# Patient Record
Sex: Female | Born: 1960 | Race: White | Hispanic: No | Marital: Single | Smoking: Never smoker
Health system: Southern US, Community
[De-identification: ages and names within clinical notes are randomized; demographics above are authoritative.]

## PROBLEM LIST (undated history)

## (undated) DIAGNOSIS — G43909 Migraine, unspecified, not intractable, without status migrainosus: Secondary | ICD-10-CM

## (undated) DIAGNOSIS — I480 Paroxysmal atrial fibrillation: Secondary | ICD-10-CM

## (undated) DIAGNOSIS — F32A Depression, unspecified: Secondary | ICD-10-CM

## (undated) DIAGNOSIS — K2210 Ulcer of esophagus without bleeding: Secondary | ICD-10-CM

## (undated) DIAGNOSIS — E785 Hyperlipidemia, unspecified: Secondary | ICD-10-CM

## (undated) DIAGNOSIS — G4700 Insomnia, unspecified: Secondary | ICD-10-CM

## (undated) DIAGNOSIS — I4891 Unspecified atrial fibrillation: Secondary | ICD-10-CM

## (undated) DIAGNOSIS — K219 Gastro-esophageal reflux disease without esophagitis: Secondary | ICD-10-CM

## (undated) DIAGNOSIS — H409 Unspecified glaucoma: Secondary | ICD-10-CM

## (undated) HISTORY — PX: CHOLECYSTECTOMY: SHX55

## (undated) HISTORY — DX: Depression, unspecified: F32.A

## (undated) HISTORY — DX: Insomnia, unspecified: G47.00

## (undated) HISTORY — DX: Paroxysmal atrial fibrillation: I48.0

## (undated) HISTORY — DX: Ulcer of esophagus without bleeding: K22.10

---

## 1999-06-16 ENCOUNTER — Encounter: Admission: RE | Admit: 1999-06-16 | Discharge: 1999-07-11 | Payer: Self-pay | Admitting: Family Medicine

## 1999-09-30 ENCOUNTER — Other Ambulatory Visit: Admission: RE | Admit: 1999-09-30 | Discharge: 1999-09-30 | Payer: Self-pay | Admitting: Obstetrics & Gynecology

## 1999-10-14 ENCOUNTER — Encounter: Admission: RE | Admit: 1999-10-14 | Discharge: 1999-10-14 | Payer: Self-pay | Admitting: Obstetrics & Gynecology

## 1999-10-14 ENCOUNTER — Encounter: Payer: Self-pay | Admitting: Obstetrics & Gynecology

## 2000-10-31 ENCOUNTER — Other Ambulatory Visit: Admission: RE | Admit: 2000-10-31 | Discharge: 2000-10-31 | Payer: Self-pay | Admitting: Obstetrics & Gynecology

## 2001-11-04 ENCOUNTER — Other Ambulatory Visit: Admission: RE | Admit: 2001-11-04 | Discharge: 2001-11-04 | Payer: Self-pay | Admitting: Obstetrics & Gynecology

## 2008-11-20 ENCOUNTER — Encounter: Admission: RE | Admit: 2008-11-20 | Discharge: 2008-11-20 | Payer: Self-pay | Admitting: Family Medicine

## 2008-12-17 ENCOUNTER — Encounter (HOSPITAL_COMMUNITY): Admission: RE | Admit: 2008-12-17 | Discharge: 2009-01-05 | Payer: Self-pay | Admitting: Family Medicine

## 2012-05-16 ENCOUNTER — Encounter (INDEPENDENT_AMBULATORY_CARE_PROVIDER_SITE_OTHER): Payer: BC Managed Care – PPO | Admitting: *Deleted

## 2012-05-16 DIAGNOSIS — I8390 Asymptomatic varicose veins of unspecified lower extremity: Secondary | ICD-10-CM

## 2020-09-16 LAB — HM PAP SMEAR

## 2021-05-08 ENCOUNTER — Emergency Department (HOSPITAL_BASED_OUTPATIENT_CLINIC_OR_DEPARTMENT_OTHER): Payer: Self-pay

## 2021-05-08 ENCOUNTER — Emergency Department (HOSPITAL_BASED_OUTPATIENT_CLINIC_OR_DEPARTMENT_OTHER)
Admission: EM | Admit: 2021-05-08 | Discharge: 2021-05-08 | Disposition: A | Discharge status: Home / Self Care | Payer: Self-pay | Attending: Emergency Medicine | Admitting: Emergency Medicine

## 2021-05-08 ENCOUNTER — Other Ambulatory Visit: Payer: Self-pay

## 2021-05-08 ENCOUNTER — Encounter (HOSPITAL_BASED_OUTPATIENT_CLINIC_OR_DEPARTMENT_OTHER): Payer: Self-pay | Admitting: Emergency Medicine

## 2021-05-08 DIAGNOSIS — D72829 Elevated white blood cell count, unspecified: Secondary | ICD-10-CM | POA: Diagnosis not present

## 2021-05-08 DIAGNOSIS — R112 Nausea with vomiting, unspecified: Secondary | ICD-10-CM | POA: Insufficient documentation

## 2021-05-08 DIAGNOSIS — R1013 Epigastric pain: Secondary | ICD-10-CM | POA: Insufficient documentation

## 2021-05-08 HISTORY — DX: Unspecified atrial fibrillation: I48.91

## 2021-05-08 HISTORY — DX: Hyperlipidemia, unspecified: E78.5

## 2021-05-08 HISTORY — DX: Unspecified glaucoma: H40.9

## 2021-05-08 HISTORY — DX: Gastro-esophageal reflux disease without esophagitis: K21.9

## 2021-05-08 HISTORY — DX: Migraine, unspecified, not intractable, without status migrainosus: G43.909

## 2021-05-08 LAB — URINALYSIS, MICROSCOPIC (REFLEX)

## 2021-05-08 LAB — URINALYSIS, ROUTINE W REFLEX MICROSCOPIC
Bilirubin Urine: NEGATIVE
Glucose, UA: NEGATIVE mg/dL
Glucose, UA: NEGATIVE mg/dL
Ketones, ur: >=80 mg/dL — AB
Ketones, ur: >=80 mg/dL — AB
Leukocytes,Ua: NEGATIVE
Nitrite: NEGATIVE
Nitrite: NEGATIVE
Protein, ur: 100 mg/dL — AB
Protein, ur: 30 mg/dL — AB
Specific Gravity, Urine: >=1.03 (ref 1.005–1.030)
Specific Gravity, Urine: 1.02 (ref 1.005–1.030)
pH: 5.5 (ref 5.0–8.0)
pH: 5.5 (ref 5.0–8.0)

## 2021-05-08 LAB — HEPATIC FUNCTION PANEL
ALT: 27 U/L (ref 0–44)
AST: 25 U/L (ref 15–41)
Albumin: 4.7 g/dL (ref 3.5–5.0)
Alkaline Phosphatase: 110 U/L (ref 38–126)
Bilirubin, Direct: 0.1 mg/dL (ref 0.0–0.2)
Indirect Bilirubin: 1.1 mg/dL — ABNORMAL HIGH (ref 0.3–0.9)
Total Bilirubin: 1.2 mg/dL (ref 0.3–1.2)
Total Protein: 8.3 g/dL — ABNORMAL HIGH (ref 6.5–8.1)

## 2021-05-08 LAB — BASIC METABOLIC PANEL
Anion gap: 18 — ABNORMAL HIGH (ref 5–15)
BUN: 16 mg/dL (ref 8–23)
CO2: 12 mmol/L — ABNORMAL LOW (ref 22–32)
Calcium: 8.8 mg/dL — ABNORMAL LOW (ref 8.9–10.3)
Chloride: 108 mmol/L (ref 98–111)
Creatinine, Ser: 0.89 mg/dL (ref 0.44–1.00)
GFR, Estimated: >60 mL/min (ref >60)
Glucose, Bld: 100 mg/dL — ABNORMAL HIGH (ref 70–99)
Potassium: 4 mmol/L (ref 3.5–5.1)
Sodium: 138 mmol/L (ref 135–145)

## 2021-05-08 LAB — LIPASE, BLOOD: Lipase: 23 U/L (ref 11–51)

## 2021-05-08 LAB — CBC WITH DIFFERENTIAL/PLATELET
Abs Immature Granulocytes: 0.02 10*3/uL (ref 0.00–0.07)
Basophils Absolute: 0 10*3/uL (ref 0.0–0.1)
Basophils Relative: 0 %
Eosinophils Absolute: 0 10*3/uL (ref 0.0–0.5)
Eosinophils Relative: 0 %
HCT: 44.2 % (ref 36.0–46.0)
Hemoglobin: 14.7 g/dL (ref 12.0–15.0)
Immature Granulocytes: 0 %
Lymphocytes Relative: 11 %
Lymphs Abs: 1.2 10*3/uL (ref 0.7–4.0)
MCH: 32.6 pg (ref 26.0–34.0)
MCHC: 33.3 g/dL (ref 30.0–36.0)
MCV: 98 fL (ref 80.0–100.0)
Monocytes Absolute: 0.4 10*3/uL (ref 0.1–1.0)
Monocytes Relative: 3 %
Neutro Abs: 10 10*3/uL — ABNORMAL HIGH (ref 1.7–7.7)
Neutrophils Relative %: 86 %
Platelets: 345 10*3/uL (ref 150–400)
RBC: 4.51 MIL/uL (ref 3.87–5.11)
RDW: 13 % (ref 11.5–15.5)
WBC: 11.7 10*3/uL — ABNORMAL HIGH (ref 4.0–10.5)
nRBC: 0 % (ref 0.0–0.2)

## 2021-05-08 MED ORDER — ONDANSETRON 4 MG PO TBDP: 4.0000 mg | ORAL_TABLET | Freq: Three times a day (TID) | ORAL | 0 refills | Status: DC | PRN

## 2021-05-08 MED ORDER — SODIUM CHLORIDE 0.9 % IV BOLUS
1000.0000 mL | Freq: Once | INTRAVENOUS | Status: AC
  Administered 2021-05-08: 1000 mL via INTRAVENOUS

## 2021-05-08 MED ORDER — ALUM & MAG HYDROXIDE-SIMETH 200-200-20 MG/5ML PO SUSP
30.0000 mL | Freq: Once | ORAL | Status: AC
Start: 2021-05-08 — End: 2021-05-08
  Administered 2021-05-08: 30 mL via ORAL
  Filled 2021-05-08: qty 30

## 2021-05-08 MED ORDER — SUCRALFATE 1 G PO TABS: 1.0000 g | ORAL_TABLET | Freq: Three times a day (TID) | ORAL | 0 refills | Status: DC

## 2021-05-08 MED ORDER — IOHEXOL 300 MG/ML  SOLN
100.0000 mL | Freq: Once | INTRAMUSCULAR | Status: AC | PRN
  Administered 2021-05-08: 100 mL via INTRAVENOUS

## 2021-05-08 MED ORDER — SUCRALFATE 1 G PO TABS
1.0000 g | ORAL_TABLET | Freq: Once | ORAL | Status: AC
  Administered 2021-05-08: 1 g via ORAL
  Filled 2021-05-08: qty 1

## 2021-05-08 MED ORDER — LIDOCAINE VISCOUS HCL 2 % MT SOLN
15.0000 mL | Freq: Once | OROMUCOSAL | Status: AC
Start: 2021-05-08 — End: 2021-05-08
  Administered 2021-05-08: 15 mL via ORAL
  Filled 2021-05-08: qty 15

## 2021-05-08 NOTE — ED Triage Notes (Signed)
Pt reports NV since Fri after taking Mag Citrate on the advice of weight loss clinic (last ozempic shot was 2 wks ago); received Phenergan IM at [facility] and was referred here for further eval ?

## 2021-05-08 NOTE — ED Provider Notes (Signed)
Care assumed from Dr. Audley Hose.  At time of transfer care, patient was awaiting CT scan to rule out intra-abdominal pathology prior to suspected discharge after p.o. challenge. ? ?Patient CT scan returned without concerning findings at this time.  She does have diverticulosis but no diverticulitis.  No evidence of obstruction or other abnormalities in the esophagus or stomach at this time. ? ?Patient continued to have some nausea, vomiting, and discomfort.  She reports the pain was in her upper abdomen so we tried a GI cocktail which helped somewhat.  We will give prescription for Carafate and patient has famotidine to use at home.  We instructed her to start taking that again and have her follow-up with GI as I suspect she may have a gastritis causing some of her symptoms and discomfort.  Patient already had prescription for nausea medicine called in but will add the Carafate as well.  Patient understood return precautions and follow-up instructions and was discharged in good condition after passing a p.o. challenge and was reassuring vital signs. ? ?Clinical Impression: ?1. Nausea and vomiting, unspecified vomiting type   ?2. Epigastric pain   ? ? ?Disposition: Discharge ? ?Condition: Good ? ?I have discussed the results, Dx and Tx plan with the pt(& family if present). He/she/they expressed understanding and agree(s) with the plan. Discharge instructions discussed at great length. Strict return precautions discussed and pt &/or family have verbalized understanding of the instructions. No further questions at time of discharge.  ? ? ?New Prescriptions  ? ONDANSETRON (ZOFRAN-ODT) 4 MG DISINTEGRATING TABLET    Take 1 tablet (4 mg total) by mouth every 8 (eight) hours as needed for nausea or vomiting.  ? SUCRALFATE (CARAFATE) 1 G TABLET    Take 1 tablet (1 g total) by mouth 4 (four) times daily -  with meals and at bedtime.  ? ? ?Follow Up: ?[facility] ?[address] ?[address] ?[place]  [address] [phone] ? ? ? ? ?[facility] ?[address] [place] ?[place] [address] ?[phone] ? ? ? ?[facility] ?[address] ?[id number] ?[place] [address] ?[phone] ? ? ? ?Rosie Fate, MD ? ? ? ? ? ? ? ?  ?December Hedtke, Canary Brim, MD ?05/08/21 2052 ? ?

## 2021-05-08 NOTE — ED Notes (Signed)
Water and saltine crackers provided for po challenge ?

## 2021-05-08 NOTE — Discharge Instructions (Signed)
Your history, exam, work-up today did not show an acute or surgical cause of your symptoms that require admission at this time.  We did suspect a gastritis or enteritis causing the nausea and vomiting and discomfort.  As the GI medications seem to help for a time, I do suspect this is likely the cause of your symptoms.  Thus, we do recommend having you take your famotidine at home as well as the Carafate to help with symptoms and make sure you are using nausea medicine to help maintain hydration.  Please call to follow-up with a gastroenterologist for further evaluation and management.  If any symptoms change or worsen acutely, please return to the nearest emergency department. ?

## 2021-05-08 NOTE — ED Provider Notes (Signed)
?[facility] ?Provider Note ? ? ?CSN: [id number] ?Arrival date & time: 05/08/21  1246 ? ?  ? ?History ? ?Chief Complaint  ?Patient presents with  ? Emesis  ? ? ?Christine Pacheco is a 62 y.o. female. ? ?Patient presents to ER chief complaint of abdominal pain nausea and vomiting.  She states that she has been going to her weight loss clinic and has been taking Ozempic.  She also had taken mag citrate about 3 days ago.  Since that she is been having abdominal pain and vomiting multiple episodes at home nonbloody.  Went to urgent care with sent to the ER for further evaluation.  Otherwise denies fevers denies diarrhea denies headache or chest pain. ? ? ?  ? ?Home Medications ?Prior to Admission medications   ?Not on File  ?   ? ?Allergies    ?Penicillins   ? ?Review of Systems   ?Review of Systems  ?Constitutional:  Negative for fever.  ?HENT:  Negative for ear pain.   ?Eyes:  Negative for pain.  ?Respiratory:  Negative for cough.   ?Cardiovascular:  Negative for chest pain.  ?Gastrointestinal:  Positive for abdominal pain.  ?Genitourinary:  Negative for flank pain.  ?Musculoskeletal:  Negative for back pain.  ?Skin:  Negative for rash.  ?Neurological:  Negative for headaches.  ? ?Physical Exam ?Updated Vital Signs ?BP 130/90   Pulse (!) 101   Temp 98.3 ?F (36.8 ?C) (Oral)   Resp 14   Ht 5\' 7"  (1.702 m)   Wt 73.5 kg   SpO2 100%   BMI 25.37 kg/m?  ?Physical Exam ?Constitutional:   ?   General: She is not in acute distress. ?   Appearance: Normal appearance.  ?HENT:  ?   Head: Normocephalic.  ?   Nose: Nose normal.  ?Eyes:  ?   Extraocular Movements: Extraocular movements intact.  ?Cardiovascular:  ?   Rate and Rhythm: Normal rate.  ?Pulmonary:  ?   Effort: Pulmonary effort is normal.  ?Abdominal:  ?   Comments: Mild abdominal distention.  Positive tenderness in epigastric region.  ?Musculoskeletal:     ?   General: Normal range of motion.  ?   Cervical back: Normal range of motion.   ?Neurological:  ?   General: No focal deficit present.  ?   Mental Status: She is alert. Mental status is at baseline.  ? ? ?ED Results / Procedures / Treatments   ?Labs ?(all labs ordered are listed, but only abnormal results are displayed) ?Labs Reviewed  ?CBC WITH DIFFERENTIAL/PLATELET - Abnormal; Notable for the following components:  ?    Result Value  ? WBC 11.7 (*)   ? Neutro Abs 10.0 (*)   ? All other components within normal limits  ?BASIC METABOLIC PANEL - Abnormal; Notable for the following components:  ? CO2 12 (*)   ? Glucose, Bld 100 (*)   ? Calcium 8.8 (*)   ? Anion gap 18 (*)   ? All other components within normal limits  ?URINALYSIS, ROUTINE W REFLEX MICROSCOPIC - Abnormal; Notable for the following components:  ? Hgb urine dipstick SMALL (*)   ? Bilirubin Urine SMALL (*)   ? Ketones, ur >=80 (*)   ? Protein, ur 100 (*)   ? Leukocytes,Ua TRACE (*)   ? All other components within normal limits  ?HEPATIC FUNCTION PANEL - Abnormal; Notable for the following components:  ? Total Protein 8.3 (*)   ? Indirect Bilirubin 1.1 (*)   ?  All other components within normal limits  ?URINALYSIS, MICROSCOPIC (REFLEX) - Abnormal; Notable for the following components:  ? Bacteria, UA MANY (*)   ? Non Squamous Epithelial PRESENT (*)   ? All other components within normal limits  ?LIPASE, BLOOD  ? ? ?EKG ?None ? ?Radiology ?No results found. ? ?Procedures ?Procedures  ? ? ?Medications Ordered in ED ?Medications  ?sodium chloride 0.9 % bolus 1,000 mL (1,000 mLs Intravenous New Bag/Given 05/08/21 1428)  ?iohexol (OMNIPAQUE) 300 MG/ML solution 100 mL (100 mLs Intravenous Contrast Given 05/08/21 1434)  ? ? ?ED Course/ Medical Decision Making/ A&P ?  ?                        ?Medical Decision Making ?Amount and/or Complexity of Data Reviewed ?Labs: ordered. ?Radiology: ordered. ? ?Risk ?Prescription drug management. ? ? ?Cardiac monitor shows sinus rhythm. ? ?Chart review shows outpatient visits throughout April at the weight  loss clinic. ? ?Labs are sent, white count 11.7 chemistry unremarkable liver enzymes normal lipase normal. ? ?CT abdomen pelvis ordered and pending. ? ?Patient given Phenergan at outside clinic today.  Given IV fluid resuscitation here. ? ? ? ? ? ? ? ?Final Clinical Impression(s) / ED Diagnoses ?Final diagnoses:  ?Nausea and vomiting, unspecified vomiting type  ? ? ?Rx / DC Orders ?ED Discharge Orders   ? ? None  ? ?  ? ? ?  ?Cheryll Cockayne, MD ?05/08/21 1441 ? ?

## 2021-05-08 NOTE — ED Notes (Signed)
Denies any nausea at this time, no further emesis has been noted since last episode. States very minimal relief from GI cocktail administered ?

## 2021-05-08 NOTE — ED Notes (Signed)
Prior to med administration, pt vomited x 3 yellow emesis, mod amt ?

## 2021-06-24 ENCOUNTER — Emergency Department (HOSPITAL_COMMUNITY): Payer: Self-pay

## 2021-06-24 ENCOUNTER — Encounter (HOSPITAL_COMMUNITY): Payer: Self-pay | Admitting: Emergency Medicine

## 2021-06-24 ENCOUNTER — Emergency Department (HOSPITAL_COMMUNITY)
Admission: EM | Admit: 2021-06-24 | Discharge: 2021-06-24 | Disposition: A | Discharge status: Home / Self Care | Payer: Self-pay | Attending: Emergency Medicine | Admitting: Emergency Medicine

## 2021-06-24 DIAGNOSIS — R112 Nausea with vomiting, unspecified: Secondary | ICD-10-CM | POA: Insufficient documentation

## 2021-06-24 DIAGNOSIS — R1033 Periumbilical pain: Secondary | ICD-10-CM | POA: Insufficient documentation

## 2021-06-24 LAB — CBC WITH DIFFERENTIAL/PLATELET
Abs Immature Granulocytes: 0.04 10*3/uL (ref 0.00–0.07)
Basophils Absolute: 0 10*3/uL (ref 0.0–0.1)
Basophils Relative: 0 %
Eosinophils Absolute: 0 10*3/uL (ref 0.0–0.5)
Eosinophils Relative: 0 %
HCT: 44.7 % (ref 36.0–46.0)
Hemoglobin: 15.5 g/dL — ABNORMAL HIGH (ref 12.0–15.0)
Immature Granulocytes: 0 %
Lymphocytes Relative: 20 %
Lymphs Abs: 1.9 10*3/uL (ref 0.7–4.0)
MCH: 33.1 pg (ref 26.0–34.0)
MCHC: 34.7 g/dL (ref 30.0–36.0)
MCV: 95.5 fL (ref 80.0–100.0)
Monocytes Absolute: 0.7 10*3/uL (ref 0.1–1.0)
Monocytes Relative: 8 %
Neutro Abs: 7.1 10*3/uL (ref 1.7–7.7)
Neutrophils Relative %: 72 %
Platelets: 315 10*3/uL (ref 150–400)
RBC: 4.68 MIL/uL (ref 3.87–5.11)
RDW: 13.6 % (ref 11.5–15.5)
WBC: 9.8 10*3/uL (ref 4.0–10.5)
nRBC: 0 % (ref 0.0–0.2)

## 2021-06-24 LAB — URINALYSIS, ROUTINE W REFLEX MICROSCOPIC
Bacteria, UA: NONE SEEN
Bilirubin Urine: NEGATIVE
Glucose, UA: NEGATIVE mg/dL
Hgb urine dipstick: NEGATIVE
Ketones, ur: 80 mg/dL — AB
Nitrite: NEGATIVE
Protein, ur: 100 mg/dL — AB
Specific Gravity, Urine: 1.02 (ref 1.005–1.030)
WBC, UA: >50 WBC/hpf — ABNORMAL HIGH (ref 0–5)
pH: 5 (ref 5.0–8.0)

## 2021-06-24 LAB — COMPREHENSIVE METABOLIC PANEL
ALT: 45 U/L — ABNORMAL HIGH (ref 0–44)
AST: 55 U/L — ABNORMAL HIGH (ref 15–41)
Albumin: 4.3 g/dL (ref 3.5–5.0)
Alkaline Phosphatase: 85 U/L (ref 38–126)
Anion gap: 13 (ref 5–15)
BUN: 56 mg/dL — ABNORMAL HIGH (ref 8–23)
CO2: 18 mmol/L — ABNORMAL LOW (ref 22–32)
Calcium: 9.4 mg/dL (ref 8.9–10.3)
Chloride: 106 mmol/L (ref 98–111)
Creatinine, Ser: 0.82 mg/dL (ref 0.44–1.00)
GFR, Estimated: >60 mL/min (ref >60)
Glucose, Bld: 153 mg/dL — ABNORMAL HIGH (ref 70–99)
Potassium: 3.6 mmol/L (ref 3.5–5.1)
Sodium: 137 mmol/L (ref 135–145)
Total Bilirubin: 2 mg/dL — ABNORMAL HIGH (ref 0.3–1.2)
Total Protein: 7.3 g/dL (ref 6.5–8.1)

## 2021-06-24 LAB — LIPASE, BLOOD: Lipase: 83 U/L — ABNORMAL HIGH (ref 11–51)

## 2021-06-24 MED ORDER — SODIUM CHLORIDE 0.9 % IV BOLUS
1000.0000 mL | Freq: Once | INTRAVENOUS | Status: AC
  Administered 2021-06-24: 1000 mL via INTRAVENOUS

## 2021-06-24 MED ORDER — ONDANSETRON HCL 8 MG PO TABS: 8.0000 mg | ORAL_TABLET | Freq: Three times a day (TID) | ORAL | 0 refills | Status: DC | PRN

## 2021-06-24 MED ORDER — ALUM & MAG HYDROXIDE-SIMETH 200-200-20 MG/5ML PO SUSP
30.0000 mL | Freq: Once | ORAL | Status: AC
  Administered 2021-06-24: 30 mL via ORAL
  Filled 2021-06-24: qty 30

## 2021-06-24 MED ORDER — ONDANSETRON HCL 4 MG/2ML IJ SOLN
4.0000 mg | Freq: Once | INTRAMUSCULAR | Status: AC
  Administered 2021-06-24: 4 mg via INTRAVENOUS
  Filled 2021-06-24: qty 2

## 2021-06-24 MED ORDER — IOHEXOL 300 MG/ML  SOLN
100.0000 mL | Freq: Once | INTRAMUSCULAR | Status: AC | PRN
Start: 2021-06-24 — End: 2021-06-24
  Administered 2021-06-24: 80 mL via INTRAVENOUS

## 2021-06-24 MED ORDER — FAMOTIDINE IN NACL 20-0.9 MG/50ML-% IV SOLN
20.0000 mg | Freq: Once | INTRAVENOUS | Status: AC
  Administered 2021-06-24: 20 mg via INTRAVENOUS
  Filled 2021-06-24: qty 50

## 2021-06-24 MED ORDER — LIDOCAINE VISCOUS HCL 2 % MT SOLN
15.0000 mL | Freq: Once | OROMUCOSAL | Status: AC
  Administered 2021-06-24: 15 mL via ORAL
  Filled 2021-06-24: qty 15

## 2021-06-24 MED ORDER — LIDOCAINE VISCOUS HCL 2 % MT SOLN
5.0000 mL | Freq: Two times a day (BID) | OROMUCOSAL | 0 refills | Status: DC | PRN
Start: 2021-06-24 — End: 2021-09-23

## 2021-06-24 NOTE — ED Triage Notes (Addendum)
Per EMS, patient from home, c/o N/V with weakness x1 month. Seen for same x3 weeks ago with no relief. Ambulatory with EMS.  BP 124/80 HR 90  20g L AC NS  Arvin Collard, nephew, [phone]

## 2021-06-24 NOTE — ED Provider Triage Note (Signed)
Emergency Medicine Provider Triage Evaluation Note  Christine Pacheco , a 61 y.o. female  was evaluated in triage.  Pt complains of patient presents the emergency department with generalized weakness.  Patient states she has been having intractable vomiting since last Saturday.  History of similar symptoms in the past.  Patient does have history of PUD and reflux.  Had an endoscopy done back in May.  No hematemesis, diarrhea.  Patient does state that she is having some decreased urinary output and dysuria.  Review of Systems  Positive:  Negative: See above  Physical Exam  BP 123/86 (BP Location: Right Arm)   Pulse 72   Temp 98.3 F (36.8 C) (Oral)   Resp 18   SpO2 100%  Gen:   Awake, no distress   Resp:  Normal effort  MSK:   Moves extremities without difficulty  Other:    Medical Decision Making  Medically screening exam initiated at 1:39 PM.  Appropriate orders placed.  Christine Pacheco was informed that the remainder of the evaluation will be completed by another provider, this initial triage assessment does not replace that evaluation, and the importance of remaining in the ED until their evaluation is complete.     Christine Pacheco, [place] 06/24/21 1339

## 2021-06-24 NOTE — Discharge Instructions (Addendum)
We did not find any abnormalities on your abdominal CAT scan to explain your nausea or vomiting.  We did not see evidence of inflammation of your pancreas, appendix, or gallbladder.  Your blood work was all fairly normal for somebody who has been vomiting for the past several days.  You do not have any profound electrolyte abnormalities that require correction in the hospital.  You improved with IV fluids and nausea medication here in the hospital.  We will send some nausea medication to your home pharmacy for you to pick up.  Please follow-up closely with your GI doctor and your primary care doctor.  Of course, if you continue to feel poorly or you become worse, please return for further evaluation and care.

## 2021-06-24 NOTE — ED Notes (Signed)
Pt given warm blankets.

## 2021-06-24 NOTE — ED Notes (Signed)
Patient transported to CT 

## 2021-06-24 NOTE — ED Provider Notes (Signed)
[facility]-EMERGENCY DEPT Provider Note   CSN: [id number] Arrival date & time: 06/24/21  1317  History Chief Complaint  Patient presents with   Weakness   Christine Pacheco is a 61 y.o. female.  61 year old female presents via EMS from home with 5 to 6-day history of worsening nausea, vomiting, and abdominal pain.  Her nephew in [place] requested the police to do a welfare check after not hearing from the patient since Saturday.  When police arrived, EMS was called and patient was reluctantly taken to the hospital. Patient reports her symptoms started last Saturday without any known trigger.  PMH includes A-fib, glaucoma, reflux, erosive esophagitis. She does report some chills. Patient denies hemoptysis, hematuria, blood in stool, diarrhea, fever, rhinorrhea, conjunctivitis, ear pain, sore throat.  No known sick contacts. She was seen in the ED 4/30 for similar symptoms.  GI follow-up after that and EGD revealed erosive esophagitis without H. pylori. She has followed with GI ever since and they have been trying several different medications, limited by allergies and intolerances per GI notes.    Home Medications Prior to Admission medications   Medication Sig Start Date End Date Taking? Authorizing Provider  ondansetron (ZOFRAN-ODT) 4 MG disintegrating tablet Take 1 tablet (4 mg total) by mouth every 8 (eight) hours as needed for nausea or vomiting. 05/08/21   Tegeler, Canary Brim, MD  sucralfate (CARAFATE) 1 g tablet Take 1 tablet (1 g total) by mouth 4 (four) times daily -  with meals and at bedtime. 05/08/21   Tegeler, Canary Brim, MD     Allergies    Penicillins    Review of Systems   Review of Systems  Constitutional:  Positive for appetite change, chills and fatigue. Negative for diaphoresis and fever.  HENT:  Negative for congestion, ear discharge, ear pain, postnasal drip, rhinorrhea, sinus pressure, sinus pain and sore throat.   Eyes:  Negative for photophobia,  pain and itching.  Respiratory:  Negative for cough, shortness of breath and wheezing.   Cardiovascular:  Negative for chest pain and palpitations.  Gastrointestinal:  Positive for abdominal pain, nausea and vomiting. Negative for abdominal distention, anal bleeding, blood in stool, constipation, diarrhea and rectal pain.  Genitourinary:  Negative for difficulty urinating, dysuria, frequency and urgency.  Neurological:  Positive for weakness. Negative for dizziness, seizures, light-headedness, numbness and headaches.   Physical Exam Updated Vital Signs BP (!) 150/87   Pulse 90   Temp 97.9 F (36.6 C) (Oral)   Resp 18   SpO2 100%  Physical Exam Constitutional:      General: She is not in acute distress.    Appearance: Normal appearance. She is normal weight. She is not ill-appearing or toxic-appearing.  HENT:     Head: Normocephalic and atraumatic.     Nose: Nose normal.  Eyes:     General: No scleral icterus.    Pupils: Pupils are equal, round, and reactive to light.  Cardiovascular:     Rate and Rhythm: Normal rate and regular rhythm.     Pulses: Normal pulses.  Pulmonary:     Effort: Pulmonary effort is normal.     Breath sounds: Normal breath sounds.  Abdominal:     General: Abdomen is flat. Bowel sounds are increased.     Palpations: Abdomen is soft.     Tenderness: There is abdominal tenderness in the periumbilical area. There is no guarding or rebound. Negative signs include Murphy's sign and McBurney's sign.  Neurological:  Mental Status: She is alert.   ED Results / Procedures / Treatments   Labs (all labs ordered are listed, but only abnormal results are displayed) Labs Reviewed  COMPREHENSIVE METABOLIC PANEL - Abnormal; Notable for the following components:      Result Value   CO2 18 (*)    Glucose, Bld 153 (*)    BUN 56 (*)    AST 55 (*)    ALT 45 (*)    Total Bilirubin 2.0 (*)    All other components within normal limits  URINALYSIS, ROUTINE W REFLEX  MICROSCOPIC - Abnormal; Notable for the following components:   APPearance CLOUDY (*)    Ketones, ur 80 (*)    Protein, ur 100 (*)    Leukocytes,Ua LARGE (*)    WBC, UA >50 (*)    All other components within normal limits  CBC WITH DIFFERENTIAL/PLATELET - Abnormal; Notable for the following components:   Hemoglobin 15.5 (*)    All other components within normal limits  LIPASE, BLOOD - Abnormal; Notable for the following components:   Lipase 83 (*)    All other components within normal limits   EKG None  Radiology No results found.  Procedures Procedures   Medications Ordered in ED Medications  sodium chloride 0.9 % bolus 1,000 mL (1,000 mLs Intravenous New Bag/Given 06/24/21 2049)  ondansetron [facility]) injection 4 mg (4 mg Intravenous Given 06/24/21 2049)   ED Course/ Medical Decision Making/ A&P                           Medical Decision Making 61 year old female with PMH of erosive esophagitis presents with approximately 1 week of nausea, vomiting, and abdominal pain.  CBC and CMP largely unremarkable, no major electrolyte abnormalities.  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  Symptoms improved with IV fluid bolus, Zofran, Pepcid, and GI cocktail.  Given stable vital signs and improvement with medication, patient is considered stable for discharge.  Discussed this with both patient and nephew over the phone.  Patient is amenable.  Return precaution discussed, see AVS for more.  Rx for Zofran sent.  Amount and/or Complexity of Data Reviewed Independent Historian:     Details: [place] in [place] via phone. External Data Reviewed: labs and radiology. Labs: ordered. Decision-making details documented in ED Course. Radiology: ordered. Decision-making details documented in ED Course.  Risk OTC drugs. Prescription drug management.  Final Clinical Impression(s) / ED Diagnoses Final diagnoses:  None   Rx / DC Orders ED Discharge Orders      None      Fayette Pho, MD   Fayette Pho, MD 06/24/21 2256    Charlynne Pander, MD 06/24/21 2317

## 2021-06-24 NOTE — ED Notes (Signed)
Nephew called, Arvin Collard, said he was the one that called the police and had them do a wellness check on her. Phone # [phone], asking for updates or you can call him for information.

## 2021-06-24 NOTE — ED Notes (Signed)
Pt given water for fluid challenge 

## 2021-07-19 ENCOUNTER — Ambulatory Visit
Admission: EM | Admit: 2021-07-19 | Discharge: 2021-07-19 | Disposition: A | Discharge status: Home / Self Care | Payer: Self-pay | Attending: Emergency Medicine | Admitting: Emergency Medicine

## 2021-07-19 DIAGNOSIS — L03213 Periorbital cellulitis: Secondary | ICD-10-CM

## 2021-07-19 MED ORDER — CIPROFLOXACIN HCL 0.3 % OP SOLN: OPHTHALMIC | 0 refills | Status: DC

## 2021-07-19 MED ORDER — CEFDINIR 300 MG PO CAPS: 300.0000 mg | ORAL_CAPSULE | Freq: Two times a day (BID) | ORAL | 0 refills | Status: AC

## 2021-07-19 NOTE — Discharge Instructions (Addendum)
Please begin Omnicef 1 capsule twice daily for the next 10 days to resolve the infection around the soft tissue surrounding your eye.  Per clinical guidelines, third-generation cephalosporins do not have any cross-reactivity with penicillins and therefore have a less than 5% likelihood of causing any kind of allergic reaction.  That being said, do please monitor for signs of allergic reaction such as itching or rash.  If you these develop, please discontinue this medication immediately and contact us here at urgent care for further treatment.  Please also begin ciprofloxacin eyedrops, 1 drop into your left eye every 2 hours while you are awake for the first 2 days then 1 drop into your left eye every 4 hours while awake for the following 5 days.  Thank you for visiting urgent care today.

## 2021-07-19 NOTE — ED Provider Notes (Signed)
[facility]    CSN: [id number] Arrival date & time: 07/19/21  1015    HISTORY   Chief Complaint  Patient presents with   Conjunctivitis   HPI Christine Pacheco is a pleasant, 61 y.o. female who presents to urgent care today complaining of Patient presents urgent care complaining of left eye pain, swelling and some numbness.  Patient states she is also noticed that she has had increased discharge from her left eye.  Patient states he has not burning, has not noticed any vision changes.  Patient states she has been using refresh eyedrops with no relief of her symptoms.  Patient denies fever, aches, chills, nausea, vomiting, diarrhea, dizziness.  Patient states that left eye pain is mostly medial upper and lower lids.  The history is provided by the patient.   Past Medical History:  Diagnosis Date   A-fib (HCC)    Acid reflux    Glaucoma    Hyperlipidemia    Migraine    There are no problems to display for this patient.  Past Surgical History:  Procedure Laterality Date   CHOLECYSTECTOMY     OB History   No obstetric history on file.    Home Medications    Prior to Admission medications   Medication Sig Start Date End Date Taking? Authorizing Provider  GI Cocktail (alum & mag hydroxide, lidocaine, dicyclomine) oral mixture Take 5 mLs by mouth 2 (two) times daily as needed. 06/24/21   Fayette Pho, MD  ondansetron (ZOFRAN) 8 MG tablet Take 1 tablet (8 mg total) by mouth every 8 (eight) hours as needed for nausea or vomiting. 06/24/21   Fayette Pho, MD  ondansetron (ZOFRAN) 8 MG tablet Take 1 tablet (8 mg total) by mouth every 8 (eight) hours as needed for nausea or vomiting. 06/24/21   Horton, Clabe Seal, DO  ondansetron (ZOFRAN-ODT) 4 MG disintegrating tablet Take 1 tablet (4 mg total) by mouth every 8 (eight) hours as needed for nausea or vomiting. 05/08/21   Tegeler, Canary Brim, MD  sucralfate (CARAFATE) 1 g tablet Take 1 tablet (1 g total) by mouth 4 (four)  times daily -  with meals and at bedtime. 05/08/21   Tegeler, Canary Brim, MD    Family History History reviewed. No pertinent family history. Social History Social History   Tobacco Use   Smoking status: Never   Smokeless tobacco: Never  Substance Use Topics   Alcohol use: Not Currently   Drug use: Not Currently   Allergies   Latex, Egg shells, Codeine, and Penicillins  Review of Systems Review of Systems Pertinent findings revealed after performing a 14 point review of systems has been noted in the history of present illness.  Physical Exam Triage Vital Signs ED Triage Vitals  Enc Vitals Group     BP 11/05/20 0827 (!) 147/82     Pulse Rate 11/05/20 0827 72     Resp 11/05/20 0827 18     Temp 11/05/20 0827 98.3 F (36.8 C)     Temp Source 11/05/20 0827 Oral     SpO2 11/05/20 0827 98 %     Weight --      Height --      Head Circumference --      Peak Flow --      Pain Score 11/05/20 0826 5     Pain Loc --      Pain Edu? --      Excl. in GC? --   No data found.  Updated Vital Signs BP 120/73 (BP Location: Left Arm)   Pulse 73   Temp 98.2 F (36.8 C) (Oral)   Resp 18   SpO2 98%   Physical Exam Vitals and nursing note reviewed.  Constitutional:      General: She is not in acute distress.    Appearance: Normal appearance. She is not ill-appearing.  HENT:     Head: Normocephalic and atraumatic.     Salivary Glands: Right salivary gland is not diffusely enlarged or tender. Left salivary gland is not diffusely enlarged or tender.     Right Ear: Tympanic membrane, ear canal and external ear normal. No drainage. No middle ear effusion. There is no impacted cerumen. Tympanic membrane is not erythematous or bulging.     Left Ear: Tympanic membrane, ear canal and external ear normal. No drainage.  No middle ear effusion. There is no impacted cerumen. Tympanic membrane is not erythematous or bulging.     Nose: Nose normal. No nasal deformity, septal deviation, mucosal  edema, congestion or rhinorrhea.     Right Turbinates: Not enlarged, swollen or pale.     Left Turbinates: Not enlarged, swollen or pale.     Right Sinus: No maxillary sinus tenderness or frontal sinus tenderness.     Left Sinus: No maxillary sinus tenderness or frontal sinus tenderness.     Mouth/Throat:     Lips: Pink. No lesions.     Mouth: Mucous membranes are moist. No oral lesions.     Pharynx: Oropharynx is clear. Uvula midline. No posterior oropharyngeal erythema or uvula swelling.     Tonsils: No tonsillar exudate. 0 on the right. 0 on the left.  Eyes:     General: Vision grossly intact.        Right eye: No discharge.        Left eye: No discharge.     Extraocular Movements: Extraocular movements intact.     Conjunctiva/sclera: Conjunctivae normal.     Right eye: Right conjunctiva is not injected.     Left eye: Left conjunctiva is not injected.     Comments: Left upper and lower eyelids are diffusely swollen, there is discharge from her left eye and crusting on her eyelids.  Upper and lower lids are tender to palpation.  Neck:     Trachea: Trachea and phonation normal.  Cardiovascular:     Rate and Rhythm: Normal rate and regular rhythm.     Pulses: Normal pulses.     Heart sounds: Normal heart sounds. No murmur heard.    No friction rub. No gallop.  Pulmonary:     Effort: Pulmonary effort is normal. No accessory muscle usage, prolonged expiration or respiratory distress.     Breath sounds: Normal breath sounds. No stridor, decreased air movement or transmitted upper airway sounds. No decreased breath sounds, wheezing, rhonchi or rales.  Chest:     Chest wall: No tenderness.  Musculoskeletal:        General: Normal range of motion.     Cervical back: Normal range of motion and neck supple. Normal range of motion.  Lymphadenopathy:     Cervical: No cervical adenopathy.  Skin:    General: Skin is warm and dry.     Findings: No erythema or rash.  Neurological:      General: No focal deficit present.     Mental Status: She is alert and oriented to person, place, and time.  Psychiatric:        Mood and  Affect: Mood normal.        Behavior: Behavior normal.     Visual Acuity Right Eye Distance:   Left Eye Distance:   Bilateral Distance:    Right Eye Near:   Left Eye Near:    Bilateral Near:     UC Couse / Diagnostics / Procedures:     Radiology No results found.  Procedures Procedures (including critical care time) EKG  Pending results:  Labs Reviewed - No data to display  Medications Ordered in UC: Medications - No data to display  UC Diagnoses / Final Clinical Impressions(s)   I have reviewed the triage vital signs and the nursing notes.  Pertinent labs & imaging results that were available during my care of the patient were reviewed by me and considered in my medical decision making (see chart for details).    Final diagnoses:  Preseptal cellulitis of left eye   Patient provided with a prescription for cefdinir and Cipro eyedrops.  Patient advised to follow-up with ophthalmologist the next few days if no improvement and certainly go to the emergency department if symptoms become worse despite treatment.  ED Prescriptions     Medication Sig Dispense Auth. Provider   cefdinir (OMNICEF) 300 MG capsule Take 1 capsule (300 mg total) by mouth 2 (two) times daily for 10 days. 20 capsule Theadora Rama Scales, PA-C   ciprofloxacin (CILOXAN) 0.3 % ophthalmic solution Administer 1 drop, every 2 hours, while awake, for 2 days. Then 1 drop, every 4 hours, while awake, for the next 5 days. 5 mL Theadora Rama Scales, PA-C      PDMP not reviewed this encounter.  Disposition Upon Discharge:  Condition: stable for discharge home Home: take medications as prescribed; routine discharge instructions as discussed; follow up as advised.  Patient presented with an acute illness with associated systemic symptoms and significant discomfort  requiring urgent management. In my opinion, this is a condition that a prudent lay person (someone who possesses an average knowledge of health and medicine) may potentially expect to result in complications if not addressed urgently such as respiratory distress, impairment of bodily function or dysfunction of bodily organs.   Routine symptom specific, illness specific and/or disease specific instructions were discussed with the patient and/or caregiver at length.   As such, the patient has been evaluated and assessed, work-up was performed and treatment was provided in alignment with urgent care protocols and evidence based medicine.  Patient/parent/caregiver has been advised that the patient may require follow up for further testing and treatment if the symptoms continue in spite of treatment, as clinically indicated and appropriate.  If the patient was tested for COVID-19, Influenza and/or RSV, then the patient/parent/guardian was advised to isolate at home pending the results of his/her diagnostic coronavirus test and potentially longer if they're positive. I have also advised pt that if his/her COVID-19 test returns positive, it's recommended to self-isolate for at least 10 days after symptoms first appeared AND until fever-free for 24 hours without fever reducer AND other symptoms have improved or resolved. Discussed self-isolation recommendations as well as instructions for household member/close contacts as per the [facility] and [place] DHHS, and also gave patient the COVID packet with this information.  Patient/parent/caregiver has been advised to return to the [facility] or PCP in 3-5 days if no better; to PCP or the Emergency Department if new signs and symptoms develop, or if the current signs or symptoms continue to change or worsen for further workup, evaluation and treatment  as clinically indicated and appropriate  The patient will follow up with their current PCP if and as advised. If the patient does not  currently have a PCP we will assist them in obtaining one.   The patient may need specialty follow up if the symptoms continue, in spite of conservative treatment and management, for further workup, evaluation, consultation and treatment as clinically indicated and appropriate.  Patient/parent/caregiver verbalized understanding and agreement of plan as discussed.  All questions were addressed during visit.  Please see discharge instructions below for further details of plan.  Discharge Instructions:   Discharge Instructions      Please begin Omnicef 1 capsule twice daily for the next 10 days to resolve the infection around the soft tissue surrounding your eye.  Per clinical guidelines, third-generation cephalosporins do not have any cross-reactivity with penicillins and therefore have a less than 5% likelihood of causing any kind of allergic reaction.  That being said, do please monitor for signs of allergic reaction such as itching or rash.  If you these develop, please discontinue this medication immediately and contact us here at urgent care for further treatment.  Thank you for visiting urgent care today.      This office note has been dictated using Teaching laboratory technician.  Unfortunately, this method of dictation can sometimes lead to typographical or grammatical errors.  I apologize for your inconvenience in advance if this occurs.  Please do not hesitate to reach out to me if clarification is needed.      Theadora Rama Scales, PA-C 07/19/21 1122

## 2021-07-19 NOTE — ED Triage Notes (Signed)
Pt c/o left eye pain, and swelling. The patient states the bones around her eye hurt.  Started: yesterday  Home interventions: Refresh eye drops

## 2021-08-15 ENCOUNTER — Ambulatory Visit
Admission: RE | Admit: 2021-08-15 | Discharge: 2021-08-15 | Disposition: A | Discharge status: Home / Self Care | Payer: Self-pay | Source: Ambulatory Visit | Attending: Family Medicine | Admitting: Family Medicine

## 2021-08-15 VITALS — BP 107/69 | HR 76 | Temp 98.1°F | Resp 18

## 2021-08-15 DIAGNOSIS — S61012A Laceration without foreign body of left thumb without damage to nail, initial encounter: Secondary | ICD-10-CM

## 2021-08-15 NOTE — Discharge Instructions (Addendum)
You can clean the wound gently with warm soapywater or peroxide once daily.  You can then put new Neosporin on.  Keep it protected and covered with a bandage

## 2021-08-15 NOTE — ED Provider Notes (Signed)
[facility]    CSN: [id number] Arrival date & time: 08/15/21  1318      History   Chief Complaint Chief Complaint  Patient presents with   Laceration    HPI Christine Pacheco is a 61 y.o. female.    Laceration  Here for a cut to her left thumb.  On the evening of August 5 she was trying to get some wax out of a candle holder.  She was scoring the wax when the glass candle holder broke.  It cut the pad of her left thumb.  It bled a lot and she had a pressure bandage on it.  She comes today to see if she needs to do any other thing to have it heal.  Last tetanus shot was in the last 5 to 10 years.     Past Medical History:  Diagnosis Date   A-fib (HCC)    Acid reflux    Glaucoma    Hyperlipidemia    Migraine     There are no problems to display for this patient.   Past Surgical History:  Procedure Laterality Date   CHOLECYSTECTOMY      OB History   No obstetric history on file.      Home Medications    Prior to Admission medications   Medication Sig Start Date End Date Taking? Authorizing Provider  esomeprazole (NEXIUM) 40 MG capsule Take by mouth. 05/10/21  Yes [provider]  GI Cocktail (alum & mag hydroxide, lidocaine, dicyclomine) oral mixture Take 5 mLs by mouth 2 (two) times daily as needed. 06/24/21   Fayette Pho, MD  sucralfate (CARAFATE) 1 g tablet Take 1 tablet (1 g total) by mouth 4 (four) times daily -  with meals and at bedtime. 05/08/21   Tegeler, Canary Brim, MD    Family History History reviewed. No pertinent family history.  Social History Social History   Tobacco Use   Smoking status: Never   Smokeless tobacco: Never  Substance Use Topics   Alcohol use: Not Currently   Drug use: Not Currently     Allergies   Latex, Egg shells, Codeine, and Penicillins   Review of Systems Review of Systems   Physical Exam Triage Vital Signs ED Triage Vitals  Enc Vitals Group     BP 08/15/21 1330 107/69     Pulse  Rate 08/15/21 1330 76     Resp 08/15/21 1330 18     Temp 08/15/21 1330 98.1 F (36.7 C)     Temp Source 08/15/21 1330 Oral     SpO2 08/15/21 1330 97 %     Weight --      Height --      Head Circumference --      Peak Flow --      Pain Score 08/15/21 1329 1     Pain Loc --      Pain Edu? --      Excl. in GC? --    No data found.  Updated Vital Signs BP 107/69 (BP Location: Right Arm)   Pulse 76   Temp 98.1 F (36.7 C) (Oral)   Resp 18   SpO2 97%   Visual Acuity Right Eye Distance:   Left Eye Distance:   Bilateral Distance:    Right Eye Near:   Left Eye Near:    Bilateral Near:     Physical Exam Vitals reviewed.  Constitutional:      General: She is not in acute distress.  Appearance: She is not ill-appearing, toxic-appearing or diaphoretic.  Skin:    Coloration: Skin is not pale.     Comments: On the pad of her left thumb there is a laceration that is about 3 cm in length the skin edges are adherent to each other at this point.  It looks like it was almost a flap type cut with the cut on the ulnar side of the lap.  There is a little ecchymosis.  No erythema or drainage  Neurological:     Mental Status: She is alert and oriented to person, place, and time.  Psychiatric:        Behavior: Behavior normal.      UC Treatments / Results  Labs (all labs ordered are listed, but only abnormal results are displayed) Labs Reviewed - No data to display  EKG   Radiology No results found.  Procedures Procedures (including critical care time)  Medications Ordered in UC Medications - No data to display  Initial Impression / Assessment and Plan / UC Course  I have reviewed the triage vital signs and the nursing notes.  Pertinent labs & imaging results that were available during my care of the patient were reviewed by me and considered in my medical decision making (see chart for details).     Since the skin edges are already starting to heal and we are over  18 hours from the time of the injury, there is no suturing needed today.  I did apply some Steri-Strips for her and we discussed wound care at length.  She does not need a tetanus shot at this time. Final Clinical Impressions(s) / UC Diagnoses   Final diagnoses:  Laceration of left thumb without foreign body without damage to nail, initial encounter     Discharge Instructions      You can clean the wound gently with warm soapywater or peroxide once daily.  You can then put new Neosporin on.  Keep it protected and covered with a bandage    ED Prescriptions   None    PDMP not reviewed this encounter.   Zenia Resides, MD 08/15/21 [phone]

## 2021-08-15 NOTE — ED Triage Notes (Signed)
The patient states yesterday she accidentally cut a deep gash in her left thumb. The edges are approximated, there is some bruising, and there is no active bleeding at this time.

## 2021-09-23 ENCOUNTER — Encounter: Payer: Self-pay | Admitting: Adult Health

## 2021-09-23 ENCOUNTER — Ambulatory Visit (INDEPENDENT_AMBULATORY_CARE_PROVIDER_SITE_OTHER): Payer: 59 | Admitting: Adult Health

## 2021-09-23 VITALS — BP 100/70 | HR 70 | Temp 98.1°F | Ht 67.0 in | Wt 157.0 lb

## 2021-09-23 DIAGNOSIS — I480 Paroxysmal atrial fibrillation: Secondary | ICD-10-CM | POA: Insufficient documentation

## 2021-09-23 DIAGNOSIS — Z7689 Persons encountering health services in other specified circumstances: Secondary | ICD-10-CM | POA: Diagnosis not present

## 2021-09-23 DIAGNOSIS — E782 Mixed hyperlipidemia: Secondary | ICD-10-CM

## 2021-09-23 DIAGNOSIS — K2210 Ulcer of esophagus without bleeding: Secondary | ICD-10-CM

## 2021-09-23 DIAGNOSIS — G4700 Insomnia, unspecified: Secondary | ICD-10-CM | POA: Insufficient documentation

## 2021-09-23 DIAGNOSIS — E785 Hyperlipidemia, unspecified: Secondary | ICD-10-CM | POA: Insufficient documentation

## 2021-09-23 DIAGNOSIS — Z8669 Personal history of other diseases of the nervous system and sense organs: Secondary | ICD-10-CM

## 2021-09-23 DIAGNOSIS — F5101 Primary insomnia: Secondary | ICD-10-CM

## 2021-09-23 DIAGNOSIS — R69 Illness, unspecified: Secondary | ICD-10-CM | POA: Diagnosis not present

## 2021-09-23 DIAGNOSIS — F32A Depression, unspecified: Secondary | ICD-10-CM | POA: Insufficient documentation

## 2021-09-23 MED ORDER — BUTALBITAL-APAP-CAFFEINE 50-325-40 MG PO TABS: 1.0000 | ORAL_TABLET | Freq: Four times a day (QID) | ORAL | 2 refills | Status: DC | PRN

## 2021-09-23 NOTE — Progress Notes (Signed)
Patient presents to clinic today to establish care. She is a pleasant 61 year old female who .  Acute Concerns: Establish Care   Chronic Issues: Erosive esophagitis -is managed by gastroenterology at [facility].  She had an EGD done in May 2023 which revealed grade C erosive esophagitis without bleeding, single submucosal papule/nodule in the stomach and nonbleeding duodenal ulcers with no stigmata of bleeding.  She also tested negative for H. pylori.  Currently managed with Nexium 40 mg twice daily and Dexilant 60 mg twice daily.  She does use liquid Carafate and GI cocktail with lidocaine as needed.  Paroxysmal atrial fibrillation-managed by cardiology at [facility]. Prescribed Multaq and lose dose metoprolol.  She was started on Plavix 75 mg daily but she reports that her gastroenterologist did not want her to be on Plavix.  She has gone back to taking aspirin  Hyperlipidemia-managed with Zetia and fish oil.  She could not tolerate statins so she stopped taking them.  She was also on Repatha but developed significant fatigue and joint pain so stopped taking this as well.  Insomnia -managed by psychiatry, takes Ambien 10 mg CR nightly.  This does help her sleep  Depression -managed by psychiatry, currently prescribed Effexor 75 mg extended release daily.  She does feel as though this controls her symptoms well  Migraine Headaches -she reports failing multiple medications including Aimovig, propranolol, and Topamax.  She currently takes Fioricet as needed.  Lower Extremity Edema -has a prescription for Lasix 20 mg daily as needed, does not feel as though this helps much.  Keep some edema bilaterally every day, elevation helps.  Health Maintenance: Dental -- Routine Care Vision -- Routine Care  Immunizations --? Colonoscopy -- Last was 5 years ago - do to polyps  Mammogram -- UTD- Seen by GYN  PAP -- UTD - GYN     Past Medical History:  Diagnosis Date   A-fib (HCC)     Acid reflux    Glaucoma    Hyperlipidemia    Migraine     Past Surgical History:  Procedure Laterality Date   CHOLECYSTECTOMY      Current Outpatient Medications on File Prior to Visit  Medication Sig Dispense Refill   baclofen (LIORESAL) 10 MG tablet Take 10 mg by mouth 3 (three) times daily.     butalbital-acetaminophen-caffeine (FIORICET) 50-325-40 MG tablet Take 1-2 tablets by mouth every 6 (six) hours as needed.     Cholecalciferol 125 MCG (5000 UT) capsule Take by mouth.     clopidogrel (PLAVIX) 75 MG tablet Take 75 mg by mouth daily.     dronedarone (MULTAQ) 400 MG tablet Take 200 mg by mouth. BID     esomeprazole (NEXIUM) 40 MG capsule Take by mouth.     ezetimibe (ZETIA) 10 MG tablet Take 10 mg by mouth daily.     famotidine (PEPCID) 40 MG/5ML suspension Take by mouth daily.     fexofenadine (ALLEGRA) 180 MG tablet Take by mouth.     fish oil-omega-3 fatty acids 1000 MG capsule Take by mouth.     furosemide (LASIX) 20 MG tablet Take by mouth.     LORazepam (ATIVAN) 0.5 MG tablet Take 1 mg by mouth 2 (two) times daily as needed.     metoprolol tartrate (LOPRESSOR) 25 MG tablet Take 1/2 (one-half) tablet by mouth twice daily     Multiple Vitamin (THERA) TABS Take 1 tablet by mouth daily.     niacin (VITAMIN B3) 500  MG ER tablet Take 500 mg by mouth at bedtime.     ondansetron (ZOFRAN) 8 MG tablet Take by mouth.     PROBIOTIC PRODUCT PO Take 1 capsule by mouth daily.     promethazine (PHENERGAN) 12.5 MG tablet Take 1 or 2 tabs as needed for nausea up to three times a day     sucralfate (CARAFATE) 1 g tablet Take 1 tablet (1 g total) by mouth 4 (four) times daily -  with meals and at bedtime. 30 tablet 0   venlafaxine XR (EFFEXOR-XR) 37.5 MG 24 hr capsule Take 37.5 mg by mouth daily.     zolpidem (AMBIEN CR) 12.5 MG CR tablet Take 12.5 mg by mouth at bedtime.     No current facility-administered medications on file prior to visit.    Allergies  Allergen Reactions   Latex  Itching   Alendronate Other (See Comments)    GI upset   Charentais Melon (French Melon) Swelling    Watermelon and cantelope   Egg Shells Swelling   Hydrocodone Other (See Comments)   Ibandronic Acid Other (See Comments)    Myalgias and GI upset   Pneumococcal Vac Polyvalent Other (See Comments)    Patient developed localized reaction to left upper arm.  Small area of redness and swelling at injection site.   Wheat Bran Rash   Codeine Nausea Only   Penicillins Hives and Itching    Family History  Problem Relation Age of Onset   Hyperlipidemia Mother    Glaucoma Mother    Memory loss Mother    Bone cancer Father    Stroke Father    Hypertension Brother    Diabetic kidney disease Brother    Clotting disorder Brother    Hypertension Maternal Grandmother    Stroke Maternal Grandmother    Heart disease Paternal Grandmother    Cataracts Paternal Grandmother    Heart attack Paternal Grandfather     Social History   Socioeconomic History   Marital status: Single    Spouse name: Not on file   Number of children: Not on file   Years of education: Not on file   Highest education level: Not on file  Occupational History   Not on file  Tobacco Use   Smoking status: Never   Smokeless tobacco: Never  Vaping Use   Vaping Use: Not on file  Substance and Sexual Activity   Alcohol use: Not Currently   Drug use: Not Currently   Sexual activity: Not on file  Other Topics Concern   Not on file  Social History Narrative   Not on file   Social Determinants of Health   Financial Resource Strain: Not on file  Food Insecurity: Not on file  Transportation Needs: Not on file  Physical Activity: Not on file  Stress: Not on file  Social Connections: Not on file  Intimate Partner Violence: Not on file    Review of Systems  Constitutional:  Positive for malaise/fatigue (chronic).  HENT: Negative.    Respiratory: Negative.    Cardiovascular: Negative.   Gastrointestinal:   Positive for abdominal pain, heartburn and nausea. Negative for blood in stool.  Genitourinary: Negative.   Musculoskeletal: Negative.   Skin: Negative.   Neurological:  Positive for weakness and headaches.  Psychiatric/Behavioral:  Positive for depression. The patient has insomnia.   All other systems reviewed and are negative.   BP 100/70   Pulse 70   Temp 98.1 F (36.7 C) (Oral)  Ht 5\' 7"  (1.702 m)   Wt 157 lb (71.2 kg)   SpO2 98%   BMI 24.59 kg/m   Physical Exam Vitals and nursing note reviewed.  Constitutional:      Appearance: Normal appearance.  HENT:     Right Ear: There is no impacted cerumen.     Left Ear: There is no impacted cerumen.  Cardiovascular:     Rate and Rhythm: Normal rate and regular rhythm.     Pulses: Normal pulses.     Heart sounds: Normal heart sounds.  Pulmonary:     Effort: Pulmonary effort is normal.     Breath sounds: Normal breath sounds.  Musculoskeletal:        General: Normal range of motion.  Skin:    General: Skin is warm and dry.  Neurological:     General: No focal deficit present.     Mental Status: She is alert and oriented to person, place, and time.  Psychiatric:        Mood and Affect: Mood normal.        Behavior: Behavior normal.        Thought Content: Thought content normal.        Judgment: Judgment normal.    Assessment/Plan: 1. Encounter to establish care - Follow up for CPE anytime   2. Erosive esophagitis - Per GI   3. PAF (paroxysmal atrial fibrillation) (HCC) - Per cardiology   4. Mixed hyperlipidemia - Continue Zetia and Fish oil   5. Primary insomnia - Follow up with psychiatry as directed  6. Depression, unspecified depression type Follow up with psychiatry as directed  7. History of migraine headaches  - butalbital-acetaminophen-caffeine (FIORICET) 50-325-40 MG tablet; Take 1-2 tablets by mouth every 6 (six) hours as needed.  Dispense: 14 tablet; Refill: 2   , NP

## 2021-09-23 NOTE — Patient Instructions (Addendum)
It was great meeting you today   Please follow up for your physical exam   If you need anything, please let me know

## 2021-10-05 ENCOUNTER — Encounter: Payer: 59 | Admitting: Adult Health

## 2021-10-26 ENCOUNTER — Encounter: Payer: Self-pay | Admitting: Adult Health

## 2021-10-26 ENCOUNTER — Ambulatory Visit (INDEPENDENT_AMBULATORY_CARE_PROVIDER_SITE_OTHER): Payer: 59 | Admitting: Adult Health

## 2021-10-26 VITALS — BP 100/78 | HR 85 | Temp 98.1°F | Ht 66.75 in | Wt 157.0 lb

## 2021-10-26 DIAGNOSIS — E782 Mixed hyperlipidemia: Secondary | ICD-10-CM

## 2021-10-26 DIAGNOSIS — F5101 Primary insomnia: Secondary | ICD-10-CM

## 2021-10-26 DIAGNOSIS — I480 Paroxysmal atrial fibrillation: Secondary | ICD-10-CM | POA: Diagnosis not present

## 2021-10-26 DIAGNOSIS — Z114 Encounter for screening for human immunodeficiency virus [HIV]: Secondary | ICD-10-CM | POA: Diagnosis not present

## 2021-10-26 DIAGNOSIS — K2210 Ulcer of esophagus without bleeding: Secondary | ICD-10-CM

## 2021-10-26 DIAGNOSIS — Z1159 Encounter for screening for other viral diseases: Secondary | ICD-10-CM | POA: Diagnosis not present

## 2021-10-26 DIAGNOSIS — Z0000 Encounter for general adult medical examination without abnormal findings: Secondary | ICD-10-CM | POA: Diagnosis not present

## 2021-10-26 DIAGNOSIS — L309 Dermatitis, unspecified: Secondary | ICD-10-CM

## 2021-10-26 DIAGNOSIS — R69 Illness, unspecified: Secondary | ICD-10-CM | POA: Diagnosis not present

## 2021-10-26 DIAGNOSIS — Z8669 Personal history of other diseases of the nervous system and sense organs: Secondary | ICD-10-CM

## 2021-10-26 DIAGNOSIS — F32A Depression, unspecified: Secondary | ICD-10-CM

## 2021-10-26 LAB — LIPID PANEL
Cholesterol: 240 mg/dL — ABNORMAL HIGH (ref 0–200)
HDL: 57 mg/dL (ref >39.00)
NonHDL: 182.61
Total CHOL/HDL Ratio: 4
Triglycerides: 233 mg/dL — ABNORMAL HIGH (ref 0.0–149.0)
VLDL: 46.6 mg/dL — ABNORMAL HIGH (ref 0.0–40.0)

## 2021-10-26 LAB — COMPREHENSIVE METABOLIC PANEL
ALT: 16 U/L (ref 0–35)
AST: 19 U/L (ref 0–37)
Albumin: 4.2 g/dL (ref 3.5–5.2)
Alkaline Phosphatase: 105 U/L (ref 39–117)
BUN: 15 mg/dL (ref 6–23)
CO2: 29 mEq/L (ref 19–32)
Calcium: 9 mg/dL (ref 8.4–10.5)
Chloride: 105 mEq/L (ref 96–112)
Creatinine, Ser: 0.69 mg/dL (ref 0.40–1.20)
GFR: 92.27 mL/min (ref >60.00)
Glucose, Bld: 100 mg/dL — ABNORMAL HIGH (ref 70–99)
Potassium: 4.2 mEq/L (ref 3.5–5.1)
Sodium: 140 mEq/L (ref 135–145)
Total Bilirubin: 0.5 mg/dL (ref 0.2–1.2)
Total Protein: 6.8 g/dL (ref 6.0–8.3)

## 2021-10-26 LAB — CBC WITH DIFFERENTIAL/PLATELET
Basophils Absolute: 0 10*3/uL (ref 0.0–0.1)
Basophils Relative: 0.5 % (ref 0.0–3.0)
Eosinophils Absolute: 0.1 10*3/uL (ref 0.0–0.7)
Eosinophils Relative: 1.8 % (ref 0.0–5.0)
HCT: 40.9 % (ref 36.0–46.0)
Hemoglobin: 13.7 g/dL (ref 12.0–15.0)
Lymphocytes Relative: 34.6 % (ref 12.0–46.0)
Lymphs Abs: 2.8 10*3/uL (ref 0.7–4.0)
MCHC: 33.4 g/dL (ref 30.0–36.0)
MCV: 98.6 fl (ref 78.0–100.0)
Monocytes Absolute: 0.5 10*3/uL (ref 0.1–1.0)
Monocytes Relative: 6.5 % (ref 3.0–12.0)
Neutro Abs: 4.6 10*3/uL (ref 1.4–7.7)
Neutrophils Relative %: 56.6 % (ref 43.0–77.0)
Platelets: 279 10*3/uL (ref 150.0–400.0)
RBC: 4.15 Mil/uL (ref 3.87–5.11)
RDW: 12.9 % (ref 11.5–15.5)
WBC: 8.2 10*3/uL (ref 4.0–10.5)

## 2021-10-26 LAB — LDL CHOLESTEROL, DIRECT: Direct LDL: 150 mg/dL

## 2021-10-26 LAB — HEMOGLOBIN A1C: Hgb A1c MFr Bld: 6.2 % (ref 4.6–6.5)

## 2021-10-26 LAB — TSH: TSH: 2.23 u[IU]/mL (ref 0.35–5.50)

## 2021-10-26 NOTE — Progress Notes (Signed)
Subjective:    Patient ID: Christine Pacheco, female    DOB: 08-Mar-1960, 61 y.o.   MRN: [id number]  HPI Patient presents for yearly preventative medicine examination. She is a pleasant 61 year old female who  has a past medical history of Depression, Erosive esophagitis, Glaucoma, Hyperlipidemia, Hyperlipidemia, Insomnia, Migraine, and PAF (paroxysmal atrial fibrillation) (HCC).  Erosive esophagitis-is managed by gastroenterology at [facility].  She had an EGD done in May 2023 which revealed grade C erosive esophagitis without bleeding, single submucosal papule/nodule in the stomach and nonbleeding duodenal ulcer with no stigmata of bleeding.  She was also tested and was negative for H. pylori.  She is currently managed with Nexium 40 mg twice daily and Dexilant 60 mg twice daily.  She does use liquid Carafate and a GI cocktail with lidocaine as needed.  Recently she was seen by gastroenterology on 10/10/2021 after a trial of baclofen 10 mg 3 times daily.  She reported that aside from occasional drowsiness her symptoms have improved.She does have alternating bowel habits.   PAF-managed by cardiology at [facility].  She is currently prescribed Multaq 100 mg twice daily and metoprolol 12.5 mg twice daily. She does take Plavix 75 mg daily. Denies bleeding issues. Has intermittent chest pain/palpitations.   Hyperlipidemia-managed with Zetia and fish oil.  In the past she could not tolerate statins so she stopped them.  She was also on Repatha but could not addord it.   Insomnia-managed by psychiatry, takes Ambien 10 mg CR nightly.  She does report that this helps her sleep well  Depression-managed by psychiatry, currently prescribed Effexor 75 mg extended release daily.  She does feel as though this controls her symptoms well  Migraine headaches-she reports failing multiple medications including Aimovig, propanolol, and Topamax.  She currently takes [place] as needed  Lower extremity edema-has a  prescription for Lasix 20 mg daily as needed but does not feel as though this helps much.  She does have some edema bilateral every day in which she believes elevation helps more the medication  All immunizations and health maintenance protocols were reviewed with the patient and needed orders were placed. She declined all vaccinations   Appropriate screening laboratory values were ordered for the patient including screening of hyperlipidemia, renal function and hepatic function.  Medication reconciliation,  past medical history, social history, problem list and allergies were reviewed in detail with the patient  Goals were established with regard to weight loss, exercise, and  diet in compliance with medications Wt Readings from Last 3 Encounters:  10/26/21 157 lb (71.2 kg)  09/23/21 157 lb (71.2 kg)  05/08/21 162 lb (73.5 kg)   She is seen by [facility] for GYN care and mammograms. She reports being overdue. She is up to date on routine colon cancer screening.  She would like a referral to dermatology for a rash she has developed on her forehead and nose. Rash is red, itchy, and flaky. She tried cortisone 10 and felt as this made her symptoms worse.     Review of Systems  Constitutional: Negative.   HENT: Negative.    Eyes: Negative.   Respiratory: Negative.    Cardiovascular: Negative.   Gastrointestinal:  Positive for abdominal pain, constipation and diarrhea. Negative for anal bleeding, blood in stool and nausea.  Endocrine: Negative.   Genitourinary: Negative.   Musculoskeletal: Negative.   Skin:  Positive for rash.  Allergic/Immunologic: Negative.   Neurological: Negative.   Hematological: Negative.   Psychiatric/Behavioral:  Positive for dysphoric mood and sleep disturbance. The patient is nervous/anxious.    Past Medical History:  Diagnosis Date   Depression    Erosive esophagitis    Glaucoma    Hyperlipidemia    Hyperlipidemia    Insomnia    Migraine    PAF  (paroxysmal atrial fibrillation) (HCC)     Social History   Socioeconomic History   Marital status: Single    Spouse name: Not on file   Number of children: Not on file   Years of education: Not on file   Highest education level: Not on file  Occupational History   Not on file  Tobacco Use   Smoking status: Never   Smokeless tobacco: Never  Vaping Use   Vaping Use: Not on file  Substance and Sexual Activity   Alcohol use: Not Currently   Drug use: Not Currently   Sexual activity: Not on file  Other Topics Concern   Not on file  Social History Narrative   She is a Air cabin crew    Social Determinants of Health   Financial Resource Strain: Not on file  Food Insecurity: Not on file  Transportation Needs: Not on file  Physical Activity: Not on file  Stress: Not on file  Social Connections: Not on file  Intimate Partner Violence: Not on file    Past Surgical History:  Procedure Laterality Date   CHOLECYSTECTOMY      Family History  Problem Relation Age of Onset   Hyperlipidemia Mother    Glaucoma Mother    Memory loss Mother    Bone cancer Father    Stroke Father    Hypertension Brother    Diabetic kidney disease Brother    Clotting disorder Brother    Hypertension Maternal Grandmother    Stroke Maternal Grandmother    Heart disease Paternal Grandmother    Cataracts Paternal Grandmother    Heart attack Paternal Grandfather     Allergies  Allergen Reactions   Latex Itching   Alendronate Other (See Comments)    GI upset   Charentais Melon (French Melon) Swelling    Watermelon and cantelope   Egg Shells Swelling   Hydrocodone Other (See Comments)   Ibandronic Acid Other (See Comments)    Myalgias and GI upset   Pneumococcal Vac Polyvalent Other (See Comments)    Patient developed localized reaction to left upper arm.  Small area of redness and swelling at injection site.   Wheat Bran Rash   Codeine Nausea Only   Penicillins Hives and Itching     Current Outpatient Medications on File Prior to Visit  Medication Sig Dispense Refill   baclofen (LIORESAL) 10 MG tablet Take 10 mg by mouth 3 (three) times daily.     butalbital-acetaminophen-caffeine (FIORICET) 50-325-40 MG tablet Take 1-2 tablets by mouth every 6 (six) hours as needed. 14 tablet 2   Cholecalciferol 125 MCG (5000 UT) capsule Take by mouth.     clopidogrel (PLAVIX) 75 MG tablet Take 75 mg by mouth daily.     dronedarone (MULTAQ) 400 MG tablet Take 200 mg by mouth. BID     esomeprazole (NEXIUM) 40 MG capsule Take by mouth.     ezetimibe (ZETIA) 10 MG tablet Take 10 mg by mouth daily.     famotidine (PEPCID) 40 MG/5ML suspension Take by mouth daily.     fexofenadine (ALLEGRA) 180 MG tablet Take by mouth.     fish oil-omega-3 fatty acids 1000 MG capsule Take by  mouth.     furosemide (LASIX) 20 MG tablet Take by mouth.     LORazepam (ATIVAN) 0.5 MG tablet Take 1 mg by mouth 2 (two) times daily as needed.     metoprolol tartrate (LOPRESSOR) 25 MG tablet Take 1/2 (one-half) tablet by mouth twice daily     Multiple Vitamin (THERA) TABS Take 1 tablet by mouth daily.     niacin (VITAMIN B3) 500 MG ER tablet Take 500 mg by mouth at bedtime.     ondansetron (ZOFRAN) 8 MG tablet Take by mouth.     PROBIOTIC PRODUCT PO Take 1 capsule by mouth daily.     promethazine (PHENERGAN) 12.5 MG tablet Take 1 or 2 tabs as needed for nausea up to three times a day     sucralfate (CARAFATE) 1 g tablet Take 1 tablet (1 g total) by mouth 4 (four) times daily -  with meals and at bedtime. 30 tablet 0   venlafaxine XR (EFFEXOR-XR) 37.5 MG 24 hr capsule Take 37.5 mg by mouth daily.     zolpidem (AMBIEN CR) 12.5 MG CR tablet Take 12.5 mg by mouth at bedtime.     No current facility-administered medications on file prior to visit.    BP 100/78   Pulse 85   Temp 98.1 F (36.7 C) (Oral)   Ht 5' 6.75" (1.695 m)   Wt 157 lb (71.2 kg)   SpO2 98%   BMI 24.77 kg/m       Objective:   Physical  Exam Vitals and nursing note reviewed.  Constitutional:      Appearance: Normal appearance.  HENT:     Right Ear: Tympanic membrane, ear canal and external ear normal. There is no impacted cerumen.     Left Ear: Tympanic membrane, ear canal and external ear normal. There is no impacted cerumen.     Nose: Nose normal.  Cardiovascular:     Rate and Rhythm: Normal rate and regular rhythm.     Pulses: Normal pulses.     Heart sounds: Normal heart sounds.  Pulmonary:     Effort: Pulmonary effort is normal.     Breath sounds: Normal breath sounds.  Abdominal:     General: Bowel sounds are normal.     Tenderness: There is abdominal tenderness in the epigastric area.  Musculoskeletal:        General: Normal range of motion.  Skin:    General: Skin is warm and dry.     Capillary Refill: Capillary refill takes less than 2 seconds.     Findings: Rash (she has a faint red rash on her forehead and on the sides of her nose. Radiates slightly to her cheaks. It is not raised and no vesicles noted.) present.  Neurological:     General: No focal deficit present.     Mental Status: She is alert and oriented to person, place, and time.       Assessment & Plan:  1. Routine general medical examination at a health care facility - Follow up in one year  - Work on lifestyle modifications  - CBC with Differential/Platelet; Future - Comprehensive metabolic panel; Future - Hemoglobin A1c; Future - Lipid panel; Future - TSH; Future - TSH - Lipid panel - Hemoglobin A1c - Comprehensive metabolic panel - CBC with Differential/Platelet  2. Erosive esophagitis - Per GI  - CBC with Differential/Platelet; Future - Comprehensive metabolic panel; Future - Hemoglobin A1c; Future - Lipid panel; Future - TSH; Future - TSH -  Lipid panel - Hemoglobin A1c - Comprehensive metabolic panel - CBC with Differential/Platelet  3. PAF (paroxysmal atrial fibrillation) (HCC) - Per cardiology  - CBC with  Differential/Platelet; Future - Comprehensive metabolic panel; Future - Hemoglobin A1c; Future - Lipid panel; Future - TSH; Future - TSH - Lipid panel - Hemoglobin A1c - Comprehensive metabolic panel - CBC with Differential/Platelet  4. Mixed hyperlipidemia - per cardiology  - CBC with Differential/Platelet; Future - Comprehensive metabolic panel; Future - Hemoglobin A1c; Future - Lipid panel; Future - TSH; Future - TSH - Lipid panel - Hemoglobin A1c - Comprehensive metabolic panel - CBC with Differential/Platelet  5. Primary insomnia - Per psychiatry   6. Depression, unspecified depression type - Per psychiatry   7. History of migraine headaches - Continue Fioricet as needed - CBC with Differential/Platelet; Future - Comprehensive metabolic panel; Future - Hemoglobin A1c; Future - Lipid panel; Future - TSH; Future - TSH - Lipid panel - Hemoglobin A1c - Comprehensive metabolic panel - CBC with Differential/Platelet  8. Need for hepatitis C screening test  - Hep C Antibody; Future - Hep C Antibody  9. Encounter for screening for HIV  - HIV Antibody (routine testing w rflx); Future - HIV Antibody (routine testing w rflx)  10. Dermatitis  - Ambulatory referral to Dermatology  Shirline Frees, NP

## 2021-10-26 NOTE — Patient Instructions (Signed)
It was great seeing you today   We will follow up with you regarding your lab work   Please let me know if you need anything   

## 2021-10-27 LAB — HIV ANTIBODY (ROUTINE TESTING W REFLEX): HIV 1&2 Ab, 4th Generation: NONREACTIVE

## 2021-10-27 LAB — HEPATITIS C ANTIBODY: Hepatitis C Ab: NONREACTIVE

## 2021-11-02 DIAGNOSIS — L218 Other seborrheic dermatitis: Secondary | ICD-10-CM | POA: Diagnosis not present

## 2021-11-02 DIAGNOSIS — L2089 Other atopic dermatitis: Secondary | ICD-10-CM | POA: Diagnosis not present

## 2021-11-21 DIAGNOSIS — G4700 Insomnia, unspecified: Secondary | ICD-10-CM | POA: Diagnosis not present

## 2021-11-21 DIAGNOSIS — K219 Gastro-esophageal reflux disease without esophagitis: Secondary | ICD-10-CM | POA: Diagnosis not present

## 2021-11-21 DIAGNOSIS — I4891 Unspecified atrial fibrillation: Secondary | ICD-10-CM | POA: Diagnosis not present

## 2021-11-21 DIAGNOSIS — R69 Illness, unspecified: Secondary | ICD-10-CM | POA: Diagnosis not present

## 2021-11-21 DIAGNOSIS — D6869 Other thrombophilia: Secondary | ICD-10-CM | POA: Diagnosis not present

## 2021-11-21 DIAGNOSIS — I10 Essential (primary) hypertension: Secondary | ICD-10-CM | POA: Diagnosis not present

## 2021-11-21 DIAGNOSIS — Z809 Family history of malignant neoplasm, unspecified: Secondary | ICD-10-CM | POA: Diagnosis not present

## 2021-11-21 DIAGNOSIS — Z818 Family history of other mental and behavioral disorders: Secondary | ICD-10-CM | POA: Diagnosis not present

## 2021-11-21 DIAGNOSIS — G43909 Migraine, unspecified, not intractable, without status migrainosus: Secondary | ICD-10-CM | POA: Diagnosis not present

## 2021-11-21 DIAGNOSIS — E785 Hyperlipidemia, unspecified: Secondary | ICD-10-CM | POA: Diagnosis not present

## 2021-11-21 DIAGNOSIS — Z880 Allergy status to penicillin: Secondary | ICD-10-CM | POA: Diagnosis not present

## 2021-11-21 DIAGNOSIS — Z8249 Family history of ischemic heart disease and other diseases of the circulatory system: Secondary | ICD-10-CM | POA: Diagnosis not present

## 2022-01-25 ENCOUNTER — Encounter: Payer: Self-pay | Admitting: Adult Health

## 2022-01-25 ENCOUNTER — Ambulatory Visit: Payer: 59 | Admitting: Adult Health

## 2022-01-25 VITALS — BP 110/60 | HR 60 | Temp 98.0°F | Ht 66.25 in | Wt 166.0 lb

## 2022-01-25 DIAGNOSIS — Z8669 Personal history of other diseases of the nervous system and sense organs: Secondary | ICD-10-CM

## 2022-01-25 DIAGNOSIS — R7303 Prediabetes: Secondary | ICD-10-CM | POA: Diagnosis not present

## 2022-01-25 LAB — POCT GLYCOSYLATED HEMOGLOBIN (HGB A1C): Hemoglobin A1C: 5.8 % — AB (ref 4.0–5.6)

## 2022-01-25 MED ORDER — BUTALBITAL-APAP-CAFFEINE 50-325-40 MG PO TABS: 1.0000 | ORAL_TABLET | Freq: Four times a day (QID) | ORAL | 3 refills | Status: DC | PRN

## 2022-01-25 NOTE — Progress Notes (Signed)
Subjective:    Patient ID: Christine Pacheco, female    DOB: 03-18-1960, 62 y.o.   MRN: [id number]  HPI  62 year old female who  has a past medical history of Depression, Erosive esophagitis, Glaucoma, Hyperlipidemia, Hyperlipidemia, Insomnia, Migraine, and PAF (paroxysmal atrial fibrillation) ([place]).  She presents to the office today for follow up regarding prediabetes. During her CPE in October 2023 it was noted that her A1c was 6.2. I advised lifestyle modifications such as cutting back on sugars/carbs, and increasing exercise. She reports today that she has not changed much in regards to her lifestyle.   Lab Results  Component Value Date   HGBA1C 5.8 (A) 01/25/2022   Wt Readings from Last 3 Encounters:  01/25/22 166 lb (75.3 kg)  10/26/21 157 lb (71.2 kg)  09/23/21 157 lb (71.2 kg)   She does need a refill of Fioricet for migraine headaches.    Review of Systems See HPI   Past Medical History:  Diagnosis Date   Depression    Erosive esophagitis    Glaucoma    Hyperlipidemia    Hyperlipidemia    Insomnia    Migraine    PAF (paroxysmal atrial fibrillation) (HCC)     Social History   Socioeconomic History   Marital status: Single    Spouse name: Not on file   Number of children: Not on file   Years of education: Not on file   Highest education level: Not on file  Occupational History   Not on file  Tobacco Use   Smoking status: Never   Smokeless tobacco: Never  Vaping Use   Vaping Use: Not on file  Substance and Sexual Activity   Alcohol use: Not Currently   Drug use: Not Currently   Sexual activity: Not on file  Other Topics Concern   Not on file  Social History Narrative   She is a Scientist, forensic    Social Determinants of Health   Financial Resource Strain: Not on file  Food Insecurity: Not on file  Transportation Needs: Not on file  Physical Activity: Not on file  Stress: Not on file  Social Connections: Not on file  Intimate Partner Violence: Not  on file    Past Surgical History:  Procedure Laterality Date   CHOLECYSTECTOMY      Family History  Problem Relation Age of Onset   Hyperlipidemia Mother    Glaucoma Mother    Memory loss Mother    Bone cancer Father    Stroke Father    Hypertension Brother    Diabetic kidney disease Brother    Clotting disorder Brother    Hypertension Maternal Grandmother    Stroke Maternal Grandmother    Heart disease Paternal Grandmother    Cataracts Paternal Grandmother    Heart attack Paternal Grandfather     Allergies  Allergen Reactions   Latex Itching   Alendronate Other (See Comments)    GI upset   Charentais Melon (French Melon) Swelling    Watermelon and cantelope   Egg Shells Swelling   Hydrocodone Other (See Comments)   Ibandronic Acid Other (See Comments)    Myalgias and GI upset   Pneumococcal Vac Polyvalent Other (See Comments)    Patient developed localized reaction to left upper arm.  Small area of redness and swelling at injection site.   Wheat Bran Rash   Codeine Nausea Only   Penicillins Hives and Itching    Current Outpatient Medications on File Prior to Visit  Medication Sig Dispense Refill   baclofen (LIORESAL) 10 MG tablet Take 10 mg by mouth 3 (three) times daily.     butalbital-acetaminophen-caffeine (FIORICET) 50-325-40 MG tablet Take 1-2 tablets by mouth every 6 (six) hours as needed. 14 tablet 2   Cholecalciferol 125 MCG (5000 UT) capsule Take by mouth.     clopidogrel (PLAVIX) 75 MG tablet Take 75 mg by mouth daily.     dronedarone (MULTAQ) 400 MG tablet Take 200 mg by mouth. BID     esomeprazole (NEXIUM) 40 MG capsule Take by mouth.     ezetimibe (ZETIA) 10 MG tablet Take 10 mg by mouth daily.     fexofenadine (ALLEGRA) 180 MG tablet Take by mouth.     fish oil-omega-3 fatty acids 1000 MG capsule Take by mouth.     ketoconazole (NIZORAL) 2 % shampoo SMARTSIG:Liberally Topical 3 Times a Week     LORazepam (ATIVAN) 0.5 MG tablet Take 1 mg by mouth 2  (two) times daily as needed.     metoprolol tartrate (LOPRESSOR) 25 MG tablet Take 1/2 (one-half) tablet by mouth twice daily     Multiple Vitamin (THERA) TABS Take 1 tablet by mouth daily.     ondansetron (ZOFRAN) 8 MG tablet Take by mouth.     pimecrolimus (ELIDEL) 1 % cream SMARTSIG:Sparingly Topical Twice Daily     PROBIOTIC PRODUCT PO Take 1 capsule by mouth daily.     promethazine (PHENERGAN) 12.5 MG tablet Take 1 or 2 tabs as needed for nausea up to three times a day     venlafaxine XR (EFFEXOR-XR) 37.5 MG 24 hr capsule Take 37.5 mg by mouth daily.     zolpidem (AMBIEN CR) 12.5 MG CR tablet Take 12.5 mg by mouth at bedtime.     No current facility-administered medications on file prior to visit.    BP 110/60   Pulse 60   Temp 98 F (36.7 C) (Oral)   Ht 5' 6.25" (1.683 m)   Wt 166 lb (75.3 kg)   SpO2 98%   BMI 26.59 kg/m       Objective:   Physical Exam Vitals and nursing note reviewed.  Constitutional:      Appearance: Normal appearance.  Cardiovascular:     Rate and Rhythm: Normal rate and regular rhythm.     Pulses: Normal pulses.     Heart sounds: Normal heart sounds.  Pulmonary:     Effort: Pulmonary effort is normal.     Breath sounds: Normal breath sounds.  Musculoskeletal:        General: Normal range of motion.  Skin:    General: Skin is warm and dry.  Neurological:     General: No focal deficit present.     Mental Status: She is alert and oriented to person, place, and time.  Psychiatric:        Mood and Affect: Mood normal.        Behavior: Behavior normal.        Thought Content: Thought content normal.        Judgment: Judgment normal.       Assessment & Plan:  1. Prediabetes  - POC HgB A1c- 5.8  - Follow up at CPE or sooner if needed  2. History of migraine headaches  - butalbital-acetaminophen-caffeine (FIORICET) 50-325-40 MG tablet; Take 1-2 tablets by mouth every 6 (six) hours as needed.  Dispense: 14 tablet; Refill: 3  Dorothyann Peng, NP

## 2022-03-08 DIAGNOSIS — H40013 Open angle with borderline findings, low risk, bilateral: Secondary | ICD-10-CM | POA: Diagnosis not present

## 2022-04-11 DIAGNOSIS — H401131 Primary open-angle glaucoma, bilateral, mild stage: Secondary | ICD-10-CM | POA: Diagnosis not present

## 2022-06-15 LAB — HM MAMMOGRAPHY

## 2022-07-05 DIAGNOSIS — Z01411 Encounter for gynecological examination (general) (routine) with abnormal findings: Secondary | ICD-10-CM | POA: Diagnosis not present

## 2022-07-05 DIAGNOSIS — Z1231 Encounter for screening mammogram for malignant neoplasm of breast: Secondary | ICD-10-CM | POA: Diagnosis not present

## 2022-07-05 DIAGNOSIS — B3731 Acute candidiasis of vulva and vagina: Secondary | ICD-10-CM | POA: Diagnosis not present

## 2022-07-13 DIAGNOSIS — Z7902 Long term (current) use of antithrombotics/antiplatelets: Secondary | ICD-10-CM | POA: Diagnosis not present

## 2022-07-13 DIAGNOSIS — Z880 Allergy status to penicillin: Secondary | ICD-10-CM | POA: Diagnosis not present

## 2022-07-13 DIAGNOSIS — I10 Essential (primary) hypertension: Secondary | ICD-10-CM | POA: Diagnosis not present

## 2022-07-13 DIAGNOSIS — I2510 Atherosclerotic heart disease of native coronary artery without angina pectoris: Secondary | ICD-10-CM | POA: Diagnosis not present

## 2022-07-13 DIAGNOSIS — F419 Anxiety disorder, unspecified: Secondary | ICD-10-CM | POA: Diagnosis not present

## 2022-07-13 DIAGNOSIS — Z8249 Family history of ischemic heart disease and other diseases of the circulatory system: Secondary | ICD-10-CM | POA: Diagnosis not present

## 2022-07-13 DIAGNOSIS — K219 Gastro-esophageal reflux disease without esophagitis: Secondary | ICD-10-CM | POA: Diagnosis not present

## 2022-07-13 DIAGNOSIS — I4891 Unspecified atrial fibrillation: Secondary | ICD-10-CM | POA: Diagnosis not present

## 2022-07-13 DIAGNOSIS — G4700 Insomnia, unspecified: Secondary | ICD-10-CM | POA: Diagnosis not present

## 2022-07-13 DIAGNOSIS — H409 Unspecified glaucoma: Secondary | ICD-10-CM | POA: Diagnosis not present

## 2022-07-13 DIAGNOSIS — E785 Hyperlipidemia, unspecified: Secondary | ICD-10-CM | POA: Diagnosis not present

## 2022-07-13 DIAGNOSIS — E669 Obesity, unspecified: Secondary | ICD-10-CM | POA: Diagnosis not present

## 2022-07-18 DIAGNOSIS — H2513 Age-related nuclear cataract, bilateral: Secondary | ICD-10-CM | POA: Diagnosis not present

## 2022-07-18 DIAGNOSIS — H401131 Primary open-angle glaucoma, bilateral, mild stage: Secondary | ICD-10-CM | POA: Diagnosis not present

## 2022-07-19 DIAGNOSIS — E7800 Pure hypercholesterolemia, unspecified: Secondary | ICD-10-CM | POA: Diagnosis not present

## 2022-07-19 DIAGNOSIS — I482 Chronic atrial fibrillation, unspecified: Secondary | ICD-10-CM | POA: Diagnosis not present

## 2022-07-19 DIAGNOSIS — E559 Vitamin D deficiency, unspecified: Secondary | ICD-10-CM | POA: Diagnosis not present

## 2022-07-19 DIAGNOSIS — M810 Age-related osteoporosis without current pathological fracture: Secondary | ICD-10-CM | POA: Diagnosis not present

## 2022-07-19 DIAGNOSIS — N200 Calculus of kidney: Secondary | ICD-10-CM | POA: Diagnosis not present

## 2022-07-19 DIAGNOSIS — K219 Gastro-esophageal reflux disease without esophagitis: Secondary | ICD-10-CM | POA: Diagnosis not present

## 2022-07-25 ENCOUNTER — Other Ambulatory Visit: Payer: Self-pay | Admitting: Adult Health

## 2022-07-25 DIAGNOSIS — Z8669 Personal history of other diseases of the nervous system and sense organs: Secondary | ICD-10-CM

## 2022-07-25 NOTE — Telephone Encounter (Signed)
Okay for refill?  

## 2022-07-27 DIAGNOSIS — N200 Calculus of kidney: Secondary | ICD-10-CM | POA: Diagnosis not present

## 2022-08-03 DIAGNOSIS — G4700 Insomnia, unspecified: Secondary | ICD-10-CM | POA: Diagnosis not present

## 2022-08-03 DIAGNOSIS — F331 Major depressive disorder, recurrent, moderate: Secondary | ICD-10-CM | POA: Diagnosis not present

## 2022-08-03 DIAGNOSIS — F411 Generalized anxiety disorder: Secondary | ICD-10-CM | POA: Diagnosis not present

## 2022-08-23 DIAGNOSIS — M810 Age-related osteoporosis without current pathological fracture: Secondary | ICD-10-CM | POA: Diagnosis not present

## 2022-08-23 DIAGNOSIS — E559 Vitamin D deficiency, unspecified: Secondary | ICD-10-CM | POA: Diagnosis not present

## 2022-09-02 ENCOUNTER — Other Ambulatory Visit: Payer: Self-pay | Admitting: Adult Health

## 2022-09-02 DIAGNOSIS — Z8669 Personal history of other diseases of the nervous system and sense organs: Secondary | ICD-10-CM

## 2022-09-05 NOTE — Telephone Encounter (Signed)
Okay for refill?  

## 2022-09-11 DIAGNOSIS — N132 Hydronephrosis with renal and ureteral calculous obstruction: Secondary | ICD-10-CM | POA: Diagnosis not present

## 2022-09-11 DIAGNOSIS — R9431 Abnormal electrocardiogram [ECG] [EKG]: Secondary | ICD-10-CM | POA: Diagnosis not present

## 2022-09-11 DIAGNOSIS — R55 Syncope and collapse: Secondary | ICD-10-CM | POA: Diagnosis not present

## 2022-09-11 DIAGNOSIS — K5790 Diverticulosis of intestine, part unspecified, without perforation or abscess without bleeding: Secondary | ICD-10-CM | POA: Diagnosis not present

## 2022-09-11 DIAGNOSIS — N200 Calculus of kidney: Secondary | ICD-10-CM | POA: Diagnosis not present

## 2022-09-11 DIAGNOSIS — R109 Unspecified abdominal pain: Secondary | ICD-10-CM | POA: Diagnosis not present

## 2022-09-11 DIAGNOSIS — N390 Urinary tract infection, site not specified: Secondary | ICD-10-CM | POA: Diagnosis not present

## 2022-09-29 DIAGNOSIS — M7662 Achilles tendinitis, left leg: Secondary | ICD-10-CM | POA: Diagnosis not present

## 2022-09-29 DIAGNOSIS — M25571 Pain in right ankle and joints of right foot: Secondary | ICD-10-CM | POA: Diagnosis not present

## 2022-09-29 DIAGNOSIS — M7671 Peroneal tendinitis, right leg: Secondary | ICD-10-CM | POA: Diagnosis not present

## 2022-09-29 DIAGNOSIS — M6702 Short Achilles tendon (acquired), left ankle: Secondary | ICD-10-CM | POA: Diagnosis not present

## 2022-09-29 DIAGNOSIS — M7672 Peroneal tendinitis, left leg: Secondary | ICD-10-CM | POA: Diagnosis not present

## 2022-09-29 DIAGNOSIS — M7661 Achilles tendinitis, right leg: Secondary | ICD-10-CM | POA: Diagnosis not present

## 2022-09-29 DIAGNOSIS — M25572 Pain in left ankle and joints of left foot: Secondary | ICD-10-CM | POA: Diagnosis not present

## 2022-10-05 DIAGNOSIS — G4700 Insomnia, unspecified: Secondary | ICD-10-CM | POA: Diagnosis not present

## 2022-10-05 DIAGNOSIS — F411 Generalized anxiety disorder: Secondary | ICD-10-CM | POA: Diagnosis not present

## 2022-10-05 DIAGNOSIS — F331 Major depressive disorder, recurrent, moderate: Secondary | ICD-10-CM | POA: Diagnosis not present

## 2022-10-05 DIAGNOSIS — E559 Vitamin D deficiency, unspecified: Secondary | ICD-10-CM | POA: Diagnosis not present

## 2022-10-11 DIAGNOSIS — M7672 Peroneal tendinitis, left leg: Secondary | ICD-10-CM | POA: Diagnosis not present

## 2022-10-11 DIAGNOSIS — M7671 Peroneal tendinitis, right leg: Secondary | ICD-10-CM | POA: Diagnosis not present

## 2022-10-11 DIAGNOSIS — R29898 Other symptoms and signs involving the musculoskeletal system: Secondary | ICD-10-CM | POA: Diagnosis not present

## 2022-10-11 DIAGNOSIS — M7662 Achilles tendinitis, left leg: Secondary | ICD-10-CM | POA: Diagnosis not present

## 2022-10-11 DIAGNOSIS — Z7409 Other reduced mobility: Secondary | ICD-10-CM | POA: Diagnosis not present

## 2022-10-11 DIAGNOSIS — M6702 Short Achilles tendon (acquired), left ankle: Secondary | ICD-10-CM | POA: Diagnosis not present

## 2022-10-11 DIAGNOSIS — Z789 Other specified health status: Secondary | ICD-10-CM | POA: Diagnosis not present

## 2022-10-11 DIAGNOSIS — M7661 Achilles tendinitis, right leg: Secondary | ICD-10-CM | POA: Diagnosis not present

## 2022-10-17 DIAGNOSIS — M9904 Segmental and somatic dysfunction of sacral region: Secondary | ICD-10-CM | POA: Diagnosis not present

## 2022-10-17 DIAGNOSIS — M810 Age-related osteoporosis without current pathological fracture: Secondary | ICD-10-CM | POA: Diagnosis not present

## 2022-10-17 DIAGNOSIS — M531 Cervicobrachial syndrome: Secondary | ICD-10-CM | POA: Diagnosis not present

## 2022-10-17 DIAGNOSIS — M9901 Segmental and somatic dysfunction of cervical region: Secondary | ICD-10-CM | POA: Diagnosis not present

## 2022-10-17 DIAGNOSIS — M9902 Segmental and somatic dysfunction of thoracic region: Secondary | ICD-10-CM | POA: Diagnosis not present

## 2022-10-17 DIAGNOSIS — M9903 Segmental and somatic dysfunction of lumbar region: Secondary | ICD-10-CM | POA: Diagnosis not present

## 2022-10-23 DIAGNOSIS — M7672 Peroneal tendinitis, left leg: Secondary | ICD-10-CM | POA: Diagnosis not present

## 2022-10-23 DIAGNOSIS — Z789 Other specified health status: Secondary | ICD-10-CM | POA: Diagnosis not present

## 2022-10-23 DIAGNOSIS — Z7409 Other reduced mobility: Secondary | ICD-10-CM | POA: Diagnosis not present

## 2022-10-23 DIAGNOSIS — R29898 Other symptoms and signs involving the musculoskeletal system: Secondary | ICD-10-CM | POA: Diagnosis not present

## 2022-10-23 DIAGNOSIS — M7661 Achilles tendinitis, right leg: Secondary | ICD-10-CM | POA: Diagnosis not present

## 2022-10-23 DIAGNOSIS — M7662 Achilles tendinitis, left leg: Secondary | ICD-10-CM | POA: Diagnosis not present

## 2022-10-23 DIAGNOSIS — M7671 Peroneal tendinitis, right leg: Secondary | ICD-10-CM | POA: Diagnosis not present

## 2022-10-23 DIAGNOSIS — M6702 Short Achilles tendon (acquired), left ankle: Secondary | ICD-10-CM | POA: Diagnosis not present

## 2022-10-24 DIAGNOSIS — M9904 Segmental and somatic dysfunction of sacral region: Secondary | ICD-10-CM | POA: Diagnosis not present

## 2022-10-24 DIAGNOSIS — M9903 Segmental and somatic dysfunction of lumbar region: Secondary | ICD-10-CM | POA: Diagnosis not present

## 2022-10-24 DIAGNOSIS — M9901 Segmental and somatic dysfunction of cervical region: Secondary | ICD-10-CM | POA: Diagnosis not present

## 2022-10-24 DIAGNOSIS — M9902 Segmental and somatic dysfunction of thoracic region: Secondary | ICD-10-CM | POA: Diagnosis not present

## 2022-10-24 DIAGNOSIS — M531 Cervicobrachial syndrome: Secondary | ICD-10-CM | POA: Diagnosis not present

## 2022-10-27 DIAGNOSIS — M9904 Segmental and somatic dysfunction of sacral region: Secondary | ICD-10-CM | POA: Diagnosis not present

## 2022-10-27 DIAGNOSIS — M25572 Pain in left ankle and joints of left foot: Secondary | ICD-10-CM | POA: Diagnosis not present

## 2022-10-27 DIAGNOSIS — M9903 Segmental and somatic dysfunction of lumbar region: Secondary | ICD-10-CM | POA: Diagnosis not present

## 2022-10-27 DIAGNOSIS — M9901 Segmental and somatic dysfunction of cervical region: Secondary | ICD-10-CM | POA: Diagnosis not present

## 2022-10-27 DIAGNOSIS — M25472 Effusion, left ankle: Secondary | ICD-10-CM | POA: Diagnosis not present

## 2022-10-27 DIAGNOSIS — M9902 Segmental and somatic dysfunction of thoracic region: Secondary | ICD-10-CM | POA: Diagnosis not present

## 2022-10-27 DIAGNOSIS — M531 Cervicobrachial syndrome: Secondary | ICD-10-CM | POA: Diagnosis not present

## 2022-10-30 DIAGNOSIS — M9904 Segmental and somatic dysfunction of sacral region: Secondary | ICD-10-CM | POA: Diagnosis not present

## 2022-10-30 DIAGNOSIS — M9901 Segmental and somatic dysfunction of cervical region: Secondary | ICD-10-CM | POA: Diagnosis not present

## 2022-10-30 DIAGNOSIS — M9902 Segmental and somatic dysfunction of thoracic region: Secondary | ICD-10-CM | POA: Diagnosis not present

## 2022-10-30 DIAGNOSIS — M531 Cervicobrachial syndrome: Secondary | ICD-10-CM | POA: Diagnosis not present

## 2022-10-30 DIAGNOSIS — M9903 Segmental and somatic dysfunction of lumbar region: Secondary | ICD-10-CM | POA: Diagnosis not present

## 2022-10-31 ENCOUNTER — Ambulatory Visit (INDEPENDENT_AMBULATORY_CARE_PROVIDER_SITE_OTHER): Payer: 59 | Admitting: Adult Health

## 2022-10-31 ENCOUNTER — Encounter: Payer: Self-pay | Admitting: Adult Health

## 2022-10-31 VITALS — BP 110/80 | HR 80 | Temp 97.9°F | Ht 66.75 in | Wt 188.0 lb

## 2022-10-31 DIAGNOSIS — Z23 Encounter for immunization: Secondary | ICD-10-CM

## 2022-10-31 DIAGNOSIS — K2210 Ulcer of esophagus without bleeding: Secondary | ICD-10-CM

## 2022-10-31 DIAGNOSIS — F5101 Primary insomnia: Secondary | ICD-10-CM | POA: Diagnosis not present

## 2022-10-31 DIAGNOSIS — I480 Paroxysmal atrial fibrillation: Secondary | ICD-10-CM

## 2022-10-31 DIAGNOSIS — R7303 Prediabetes: Secondary | ICD-10-CM

## 2022-10-31 DIAGNOSIS — E782 Mixed hyperlipidemia: Secondary | ICD-10-CM | POA: Diagnosis not present

## 2022-10-31 DIAGNOSIS — Z8669 Personal history of other diseases of the nervous system and sense organs: Secondary | ICD-10-CM

## 2022-10-31 DIAGNOSIS — Z124 Encounter for screening for malignant neoplasm of cervix: Secondary | ICD-10-CM | POA: Diagnosis not present

## 2022-10-31 DIAGNOSIS — F32A Depression, unspecified: Secondary | ICD-10-CM | POA: Diagnosis not present

## 2022-10-31 DIAGNOSIS — Z0000 Encounter for general adult medical examination without abnormal findings: Secondary | ICD-10-CM

## 2022-10-31 DIAGNOSIS — R600 Localized edema: Secondary | ICD-10-CM | POA: Diagnosis not present

## 2022-10-31 LAB — COMPREHENSIVE METABOLIC PANEL
ALT: 16 U/L (ref 0–35)
AST: 19 U/L (ref 0–37)
Albumin: 4.3 g/dL (ref 3.5–5.2)
Alkaline Phosphatase: 111 U/L (ref 39–117)
BUN: 22 mg/dL (ref 6–23)
CO2: 28 meq/L (ref 19–32)
Calcium: 8.3 mg/dL — ABNORMAL LOW (ref 8.4–10.5)
Chloride: 101 meq/L (ref 96–112)
Creatinine, Ser: 0.77 mg/dL (ref 0.40–1.20)
GFR: 81.43 mL/min (ref >60.00)
Glucose, Bld: 110 mg/dL — ABNORMAL HIGH (ref 70–99)
Potassium: 3.4 meq/L — ABNORMAL LOW (ref 3.5–5.1)
Sodium: 140 meq/L (ref 135–145)
Total Bilirubin: 0.5 mg/dL (ref 0.2–1.2)
Total Protein: 6.9 g/dL (ref 6.0–8.3)

## 2022-10-31 LAB — CBC WITH DIFFERENTIAL/PLATELET
Basophils Absolute: 0 10*3/uL (ref 0.0–0.1)
Basophils Relative: 0.4 % (ref 0.0–3.0)
Eosinophils Absolute: 0.1 10*3/uL (ref 0.0–0.7)
Eosinophils Relative: 0.8 % (ref 0.0–5.0)
HCT: 41.4 % (ref 36.0–46.0)
Hemoglobin: 13.3 g/dL (ref 12.0–15.0)
Lymphocytes Relative: 37.2 % (ref 12.0–46.0)
Lymphs Abs: 2.6 10*3/uL (ref 0.7–4.0)
MCHC: 32.1 g/dL (ref 30.0–36.0)
MCV: 97.8 fL (ref 78.0–100.0)
Monocytes Absolute: 0.6 10*3/uL (ref 0.1–1.0)
Monocytes Relative: 9.2 % (ref 3.0–12.0)
Neutro Abs: 3.6 10*3/uL (ref 1.4–7.7)
Neutrophils Relative %: 52.4 % (ref 43.0–77.0)
Platelets: 320 10*3/uL (ref 150.0–400.0)
RBC: 4.23 Mil/uL (ref 3.87–5.11)
RDW: 13.7 % (ref 11.5–15.5)
WBC: 6.9 10*3/uL (ref 4.0–10.5)

## 2022-10-31 LAB — LIPID PANEL
Cholesterol: 259 mg/dL — ABNORMAL HIGH (ref 0–200)
HDL: 50.3 mg/dL (ref >39.00)
LDL Cholesterol: 164 mg/dL — ABNORMAL HIGH (ref 0–99)
NonHDL: 208.83
Total CHOL/HDL Ratio: 5
Triglycerides: 226 mg/dL — ABNORMAL HIGH (ref 0.0–149.0)
VLDL: 45.2 mg/dL — ABNORMAL HIGH (ref 0.0–40.0)

## 2022-10-31 LAB — TSH: TSH: 2.29 u[IU]/mL (ref 0.35–5.50)

## 2022-10-31 LAB — HEMOGLOBIN A1C: Hgb A1c MFr Bld: 6 % (ref 4.6–6.5)

## 2022-10-31 NOTE — Progress Notes (Signed)
Subjective:    Patient ID: Christine Pacheco, female    DOB: [birth year], 62 y.o.   MRN: [id number]  HPI  Patient presents for yearly preventative medicine examination. She is a pleasant 62 year old female who  has a past medical history of Depression, Erosive esophagitis, Glaucoma, Hyperlipidemia, Hyperlipidemia, Insomnia, Migraine, and PAF (paroxysmal atrial fibrillation) (HCC).  Erosive esophagitis-is managed by gastroenterology at [facility].  She had an EGD done in May 2023 which revealed grade C erosive esophagitis without bleeding, single submucosal papule/nodule in the stomach and nonbleeding duodenal ulcer with no stigmata of bleeding.  She was also tested and was negative for H. pylori.  She is currently managed with Nexium 40 mg twice daily and Dexilant 60 mg twice daily.  She does use liquid Carafate and a GI cocktail with lidocaine as needed.  Recently she was seen by gastroenterology on 10/10/2021 after a trial of baclofen 10 mg 3 times daily.  She reported that aside from occasional drowsiness her symptoms have improved.She does have alternating bowel habits.   PAF-managed by cardiology at [facility].  She is currently prescribed Multaq 100 mg twice daily and metoprolol 12.5 mg twice daily. She does take Plavix 75 mg daily. Denies bleeding issues. Has intermittent chest pain/palpitations. She has not been taking her Plavix due to fatigue.   Hyperlipidemia-managed with Zetia and fish oil.  In the past she could not tolerate statins so she stopped them.  She was also on Repatha but could not addord it.   Insomnia-managed by psychiatry, takes Ambien 10 mg CR nightly.  She does report that this helps her sleep well  Depression-managed by psychiatry, currently prescribed Effexor 75 mg extended release daily.  She does feel as though this controls her symptoms well  Migraine headaches-she reports failing multiple medications including Aimovig, propanolol, Nurtec, botox,  and Topamax.  She  currently takes Fioricet as needed. She varies between 1-5 migraines a month..   Lower extremity edema-has a prescription for Lasix 20 mg daily as needed but does not feel as though this helps much.  She does have some edema bilateral every day in which she believes elevation helps more the medication  Prediabetes - not currently on medication. Was able to lower her A1c was 6.2 to 5.8 with lifestyle modifications  Lab Results  Component Value Date   HGBA1C 5.8 (A) 01/25/2022   All immunizations and health maintenance protocols were reviewed with the patient and needed orders were placed.  Appropriate screening laboratory values were ordered for the patient including screening of hyperlipidemia, renal function and hepatic function.  Medication reconciliation,  past medical history, social history, problem list and allergies were reviewed in detail with the patient  Goals were established with regard to weight loss, exercise, and  diet in compliance with medications Wt Readings from Last 3 Encounters:  10/31/22 188 lb (85.3 kg)  01/25/22 166 lb (75.3 kg)  10/26/21 157 lb (71.2 kg)   She is up to date on routine colon cancer screening    Review of Systems  Constitutional: Negative.   HENT: Negative.    Eyes: Negative.   Respiratory: Negative.    Cardiovascular: Negative.   Gastrointestinal: Negative.   Endocrine: Negative.   Genitourinary: Negative.   Musculoskeletal: Negative.   Skin: Negative.   Allergic/Immunologic: Negative.   Neurological: Negative.   Hematological: Negative.   Psychiatric/Behavioral: Negative.      Past Medical History:  Diagnosis Date   Depression    Erosive esophagitis  Glaucoma    Hyperlipidemia    Hyperlipidemia    Insomnia    Migraine    PAF (paroxysmal atrial fibrillation) (HCC)     Social History   Socioeconomic History   Marital status: Single    Spouse name: Not on file   Number of children: Not on file   Years of education:  Not on file   Highest education level: Not on file  Occupational History   Not on file  Tobacco Use   Smoking status: Never   Smokeless tobacco: Never  Vaping Use   Vaping status: Not on file  Substance and Sexual Activity   Alcohol use: Not Currently   Drug use: Not Currently   Sexual activity: Not on file  Other Topics Concern   Not on file  Social History Narrative   She is a Air cabin crew    Social Determinants of Health   Financial Resource Strain: Not on file  Food Insecurity: No Food Insecurity (03/03/2021)   Received [facility], [facility]   Hunger Vital Sign    Worried About Running Out of Food in the Last Year: Never true    Ran Out of Food in the Last Year: Never true  Transportation Needs: Not on file  Physical Activity: Not on file  Stress: Not on file  Social Connections: Unknown (05/09/2021)   Received from [facility], [facility]   Social Network    Social Network: Not on file  Intimate Partner Violence: Not At Risk (09/11/2022)   Received from [facility]   HITS    Over the last 12 months how often did your partner physically hurt you?: 1    Over the last 12 months how often did your partner insult you or talk down to you?: 1    Over the last 12 months how often did your partner threaten you with physical harm?: 1    Over the last 12 months how often did your partner scream or curse at you?: 1    Past Surgical History:  Procedure Laterality Date   CHOLECYSTECTOMY      Family History  Problem Relation Age of Onset   Hyperlipidemia Mother    Glaucoma Mother    Memory loss Mother    Bone cancer Father    Stroke Father    Hypertension Brother    Diabetic kidney disease Brother    Clotting disorder Brother    Hypertension Maternal Grandmother    Stroke Maternal Grandmother    Heart disease Paternal Grandmother    Cataracts Paternal Grandmother    Heart attack Paternal Grandfather     Allergies  Allergen Reactions   Latex  Itching   Alendronate Other (See Comments)    GI upset   Charentais Melon (French Melon) Swelling    Watermelon and cantelope   Egg Shells Swelling   Hydrocodone Other (See Comments)   Ibandronic Acid Other (See Comments)    Myalgias and GI upset   Pneumococcal Vac Polyvalent Other (See Comments)    Patient developed localized reaction to left upper arm.  Small area of redness and swelling at injection site.   Wheat Rash   Codeine Nausea Only   Penicillins Hives and Itching    Current Outpatient Medications on File Prior to Visit  Medication Sig Dispense Refill   baclofen (LIORESAL) 10 MG tablet Take 10 mg by mouth 3 (three) times daily.     butalbital-acetaminophen-caffeine (FIORICET) 50-325-40 MG tablet TAKE 1 TO 2 TABLETS  BY MOUTH EVERY 6 HOURS AS NEEDED 14 tablet 3   clopidogrel (PLAVIX) 75 MG tablet Take 75 mg by mouth daily.     dorzolamide-timolol (COSOPT) 2-0.5 % ophthalmic solution 1 drop 2 (two) times daily.     dronedarone (MULTAQ) 400 MG tablet Take 200 mg by mouth. BID     esomeprazole (NEXIUM) 40 MG capsule Take by mouth.     ezetimibe (ZETIA) 10 MG tablet Take 10 mg by mouth daily.     famotidine (PEPCID) 40 MG tablet as directed Orally     fexofenadine (ALLEGRA) 180 MG tablet Take by mouth.     fish oil-omega-3 fatty acids 1000 MG capsule Take by mouth.     ketoconazole (NIZORAL) 2 % shampoo SMARTSIG:Liberally Topical 3 Times a Week     latanoprost (XALATAN) 0.005 % ophthalmic solution SMARTSIG:In Eye(s)     LORazepam (ATIVAN) 0.5 MG tablet Take 1 mg by mouth 2 (two) times daily as needed.     metoprolol tartrate (LOPRESSOR) 25 MG tablet Take 1/2 (one-half) tablet by mouth twice daily     Multiple Vitamin (THERA) TABS Take 1 tablet by mouth daily.     ondansetron (ZOFRAN) 8 MG tablet Take by mouth.     pimecrolimus (ELIDEL) 1 % cream SMARTSIG:Sparingly Topical Twice Daily     PROBIOTIC PRODUCT PO Take 1 capsule by mouth daily.     promethazine (PHENERGAN) 12.5 MG  tablet Take 1 or 2 tabs as needed for nausea up to three times a day     venlafaxine XR (EFFEXOR-XR) 37.5 MG 24 hr capsule Take 37.5 mg by mouth daily.     Vitamin D, Ergocalciferol, (DRISDOL) 1.25 MG (50000 UNIT) CAPS capsule Take 50,000 Units by mouth once a week.     zolpidem (AMBIEN CR) 12.5 MG CR tablet Take 12.5 mg by mouth at bedtime.     No current facility-administered medications on file prior to visit.    BP 110/80   Pulse 80   Temp 97.9 F (36.6 C) (Oral)   Ht 5' 6.75" (1.695 m)   Wt 188 lb (85.3 kg)   SpO2 98%   BMI 29.67 kg/m       Objective:   Physical Exam Vitals and nursing note reviewed.  Constitutional:      Appearance: Normal appearance. She is overweight.  Cardiovascular:     Rate and Rhythm: Normal rate and regular rhythm.     Pulses: Normal pulses.     Heart sounds: Normal heart sounds.  Pulmonary:     Effort: Pulmonary effort is normal.     Breath sounds: Normal breath sounds.  Musculoskeletal:        General: Normal range of motion.  Skin:    General: Skin is warm and dry.  Neurological:     General: No focal deficit present.     Mental Status: She is alert and oriented to person, place, and time.  Psychiatric:        Mood and Affect: Mood normal.        Behavior: Behavior normal.        Thought Content: Thought content normal.        Judgment: Judgment normal.        Assessment & Plan:  1. Routine general medical examination at a health care facility Today patient counseled on age appropriate routine health concerns for screening and prevention, each reviewed and up to date or declined. Immunizations reviewed and up to date or declined. Labs  ordered and reviewed. Risk factors for depression reviewed and negative. Hearing function and visual acuity are intact. ADLs screened and addressed as needed. Functional ability and level of safety reviewed and appropriate. Education, counseling and referrals performed based on assessed risks today.  Patient provided with a copy of personalized plan for preventive services. - Follow up in one year or sooner if needed - Encouraged diet and exercise - her weight is up 20 pounds over the year   2. Erosive esophagitis - Per GI  - CBC with Differential/Platelet; Future - Comprehensive metabolic panel; Future - Lipid panel; Future - TSH; Future  3. PAF (paroxysmal atrial fibrillation) (HCC) - Advised to restart Plavix - Follow up with Cardiology  - CBC with Differential/Platelet; Future - Comprehensive metabolic panel; Future - Lipid panel; Future - TSH; Future  4. Mixed hyperlipidemia - Per cardiology  - CBC with Differential/Platelet; Future - Comprehensive metabolic panel; Future - Lipid panel; Future - TSH; Future  5. Primary insomnia - Per psychiatry  - CBC with Differential/Platelet; Future - Comprehensive metabolic panel; Future - Lipid panel; Future - TSH; Future  6. Depression, unspecified depression type - Per psychiatry  - CBC with Differential/Platelet; Future - Comprehensive metabolic panel; Future - Lipid panel; Future - TSH; Future  7. History of migraine headaches - Continue with Fiocicet PRN  - CBC with Differential/Platelet; Future - Comprehensive metabolic panel; Future - Lipid panel; Future - TSH; Future  8. Lower extremity edema - Can take Lasix PRN  - CBC with Differential/Platelet; Future - Comprehensive metabolic panel; Future - Lipid panel; Future - TSH; Future  9. Prediabetes - Consider metformin  - CBC with Differential/Platelet; Future - Comprehensive metabolic panel; Future - Lipid panel; Future - TSH; Future - Hemoglobin A1c; Future  10. Cervical cancer screening - She is going to schedule with GYN   11. Need for influenza vaccination  - Flu vaccine trivalent PF, 6mos and older(Flulaval,Afluria,Fluarix,Fluzone)  Shirline Frees, NP

## 2022-10-31 NOTE — Patient Instructions (Signed)
It was great seeing you today   We will follow up with you regarding your lab work   Please let me know if you need anything   

## 2022-11-01 DIAGNOSIS — M7671 Peroneal tendinitis, right leg: Secondary | ICD-10-CM | POA: Diagnosis not present

## 2022-11-01 DIAGNOSIS — M7662 Achilles tendinitis, left leg: Secondary | ICD-10-CM | POA: Diagnosis not present

## 2022-11-01 DIAGNOSIS — M7672 Peroneal tendinitis, left leg: Secondary | ICD-10-CM | POA: Diagnosis not present

## 2022-11-01 DIAGNOSIS — Z7409 Other reduced mobility: Secondary | ICD-10-CM | POA: Diagnosis not present

## 2022-11-01 DIAGNOSIS — M6702 Short Achilles tendon (acquired), left ankle: Secondary | ICD-10-CM | POA: Diagnosis not present

## 2022-11-01 DIAGNOSIS — Z789 Other specified health status: Secondary | ICD-10-CM | POA: Diagnosis not present

## 2022-11-01 DIAGNOSIS — R29898 Other symptoms and signs involving the musculoskeletal system: Secondary | ICD-10-CM | POA: Diagnosis not present

## 2022-11-01 DIAGNOSIS — M7661 Achilles tendinitis, right leg: Secondary | ICD-10-CM | POA: Diagnosis not present

## 2022-11-03 DIAGNOSIS — M9904 Segmental and somatic dysfunction of sacral region: Secondary | ICD-10-CM | POA: Diagnosis not present

## 2022-11-03 DIAGNOSIS — M531 Cervicobrachial syndrome: Secondary | ICD-10-CM | POA: Diagnosis not present

## 2022-11-03 DIAGNOSIS — M9903 Segmental and somatic dysfunction of lumbar region: Secondary | ICD-10-CM | POA: Diagnosis not present

## 2022-11-03 DIAGNOSIS — M9901 Segmental and somatic dysfunction of cervical region: Secondary | ICD-10-CM | POA: Diagnosis not present

## 2022-11-03 DIAGNOSIS — M9902 Segmental and somatic dysfunction of thoracic region: Secondary | ICD-10-CM | POA: Diagnosis not present

## 2022-11-06 DIAGNOSIS — Z789 Other specified health status: Secondary | ICD-10-CM | POA: Diagnosis not present

## 2022-11-06 DIAGNOSIS — M7672 Peroneal tendinitis, left leg: Secondary | ICD-10-CM | POA: Diagnosis not present

## 2022-11-06 DIAGNOSIS — Z7409 Other reduced mobility: Secondary | ICD-10-CM | POA: Diagnosis not present

## 2022-11-06 DIAGNOSIS — M6702 Short Achilles tendon (acquired), left ankle: Secondary | ICD-10-CM | POA: Diagnosis not present

## 2022-11-06 DIAGNOSIS — M7671 Peroneal tendinitis, right leg: Secondary | ICD-10-CM | POA: Diagnosis not present

## 2022-11-06 DIAGNOSIS — R29898 Other symptoms and signs involving the musculoskeletal system: Secondary | ICD-10-CM | POA: Diagnosis not present

## 2022-11-06 DIAGNOSIS — M7662 Achilles tendinitis, left leg: Secondary | ICD-10-CM | POA: Diagnosis not present

## 2022-11-06 DIAGNOSIS — M7661 Achilles tendinitis, right leg: Secondary | ICD-10-CM | POA: Diagnosis not present

## 2022-11-07 DIAGNOSIS — M9904 Segmental and somatic dysfunction of sacral region: Secondary | ICD-10-CM | POA: Diagnosis not present

## 2022-11-07 DIAGNOSIS — M531 Cervicobrachial syndrome: Secondary | ICD-10-CM | POA: Diagnosis not present

## 2022-11-07 DIAGNOSIS — M9901 Segmental and somatic dysfunction of cervical region: Secondary | ICD-10-CM | POA: Diagnosis not present

## 2022-11-07 DIAGNOSIS — M9903 Segmental and somatic dysfunction of lumbar region: Secondary | ICD-10-CM | POA: Diagnosis not present

## 2022-11-07 DIAGNOSIS — M9902 Segmental and somatic dysfunction of thoracic region: Secondary | ICD-10-CM | POA: Diagnosis not present

## 2022-11-08 DIAGNOSIS — M7661 Achilles tendinitis, right leg: Secondary | ICD-10-CM | POA: Diagnosis not present

## 2022-11-08 DIAGNOSIS — M7671 Peroneal tendinitis, right leg: Secondary | ICD-10-CM | POA: Diagnosis not present

## 2022-11-08 DIAGNOSIS — M6702 Short Achilles tendon (acquired), left ankle: Secondary | ICD-10-CM | POA: Diagnosis not present

## 2022-11-08 DIAGNOSIS — Z7409 Other reduced mobility: Secondary | ICD-10-CM | POA: Diagnosis not present

## 2022-11-08 DIAGNOSIS — M7662 Achilles tendinitis, left leg: Secondary | ICD-10-CM | POA: Diagnosis not present

## 2022-11-08 DIAGNOSIS — Z789 Other specified health status: Secondary | ICD-10-CM | POA: Diagnosis not present

## 2022-11-08 DIAGNOSIS — M7672 Peroneal tendinitis, left leg: Secondary | ICD-10-CM | POA: Diagnosis not present

## 2022-11-08 DIAGNOSIS — R29898 Other symptoms and signs involving the musculoskeletal system: Secondary | ICD-10-CM | POA: Diagnosis not present

## 2022-11-10 DIAGNOSIS — M9902 Segmental and somatic dysfunction of thoracic region: Secondary | ICD-10-CM | POA: Diagnosis not present

## 2022-11-10 DIAGNOSIS — M531 Cervicobrachial syndrome: Secondary | ICD-10-CM | POA: Diagnosis not present

## 2022-11-10 DIAGNOSIS — M9903 Segmental and somatic dysfunction of lumbar region: Secondary | ICD-10-CM | POA: Diagnosis not present

## 2022-11-10 DIAGNOSIS — M9901 Segmental and somatic dysfunction of cervical region: Secondary | ICD-10-CM | POA: Diagnosis not present

## 2022-11-10 DIAGNOSIS — M9904 Segmental and somatic dysfunction of sacral region: Secondary | ICD-10-CM | POA: Diagnosis not present

## 2022-11-13 DIAGNOSIS — M7672 Peroneal tendinitis, left leg: Secondary | ICD-10-CM | POA: Diagnosis not present

## 2022-11-13 DIAGNOSIS — M9901 Segmental and somatic dysfunction of cervical region: Secondary | ICD-10-CM | POA: Diagnosis not present

## 2022-11-13 DIAGNOSIS — M7662 Achilles tendinitis, left leg: Secondary | ICD-10-CM | POA: Diagnosis not present

## 2022-11-13 DIAGNOSIS — R29898 Other symptoms and signs involving the musculoskeletal system: Secondary | ICD-10-CM | POA: Diagnosis not present

## 2022-11-13 DIAGNOSIS — M7671 Peroneal tendinitis, right leg: Secondary | ICD-10-CM | POA: Diagnosis not present

## 2022-11-13 DIAGNOSIS — Z789 Other specified health status: Secondary | ICD-10-CM | POA: Diagnosis not present

## 2022-11-13 DIAGNOSIS — M531 Cervicobrachial syndrome: Secondary | ICD-10-CM | POA: Diagnosis not present

## 2022-11-13 DIAGNOSIS — M6702 Short Achilles tendon (acquired), left ankle: Secondary | ICD-10-CM | POA: Diagnosis not present

## 2022-11-13 DIAGNOSIS — Z7409 Other reduced mobility: Secondary | ICD-10-CM | POA: Diagnosis not present

## 2022-11-13 DIAGNOSIS — M9903 Segmental and somatic dysfunction of lumbar region: Secondary | ICD-10-CM | POA: Diagnosis not present

## 2022-11-13 DIAGNOSIS — M7661 Achilles tendinitis, right leg: Secondary | ICD-10-CM | POA: Diagnosis not present

## 2022-11-13 DIAGNOSIS — M9904 Segmental and somatic dysfunction of sacral region: Secondary | ICD-10-CM | POA: Diagnosis not present

## 2022-11-13 DIAGNOSIS — M9902 Segmental and somatic dysfunction of thoracic region: Secondary | ICD-10-CM | POA: Diagnosis not present

## 2022-11-15 DIAGNOSIS — M9901 Segmental and somatic dysfunction of cervical region: Secondary | ICD-10-CM | POA: Diagnosis not present

## 2022-11-15 DIAGNOSIS — M531 Cervicobrachial syndrome: Secondary | ICD-10-CM | POA: Diagnosis not present

## 2022-11-15 DIAGNOSIS — M9903 Segmental and somatic dysfunction of lumbar region: Secondary | ICD-10-CM | POA: Diagnosis not present

## 2022-11-15 DIAGNOSIS — M9902 Segmental and somatic dysfunction of thoracic region: Secondary | ICD-10-CM | POA: Diagnosis not present

## 2022-11-15 DIAGNOSIS — M9904 Segmental and somatic dysfunction of sacral region: Secondary | ICD-10-CM | POA: Diagnosis not present

## 2022-11-20 DIAGNOSIS — M9901 Segmental and somatic dysfunction of cervical region: Secondary | ICD-10-CM | POA: Diagnosis not present

## 2022-11-20 DIAGNOSIS — M9902 Segmental and somatic dysfunction of thoracic region: Secondary | ICD-10-CM | POA: Diagnosis not present

## 2022-11-20 DIAGNOSIS — M9904 Segmental and somatic dysfunction of sacral region: Secondary | ICD-10-CM | POA: Diagnosis not present

## 2022-11-20 DIAGNOSIS — M531 Cervicobrachial syndrome: Secondary | ICD-10-CM | POA: Diagnosis not present

## 2022-11-20 DIAGNOSIS — M9903 Segmental and somatic dysfunction of lumbar region: Secondary | ICD-10-CM | POA: Diagnosis not present

## 2022-11-22 DIAGNOSIS — M9903 Segmental and somatic dysfunction of lumbar region: Secondary | ICD-10-CM | POA: Diagnosis not present

## 2022-11-22 DIAGNOSIS — M9902 Segmental and somatic dysfunction of thoracic region: Secondary | ICD-10-CM | POA: Diagnosis not present

## 2022-11-22 DIAGNOSIS — M9901 Segmental and somatic dysfunction of cervical region: Secondary | ICD-10-CM | POA: Diagnosis not present

## 2022-11-22 DIAGNOSIS — M531 Cervicobrachial syndrome: Secondary | ICD-10-CM | POA: Diagnosis not present

## 2022-11-22 DIAGNOSIS — M9904 Segmental and somatic dysfunction of sacral region: Secondary | ICD-10-CM | POA: Diagnosis not present

## 2022-11-23 ENCOUNTER — Encounter: Payer: Self-pay | Admitting: Family Medicine

## 2022-11-23 ENCOUNTER — Ambulatory Visit (INDEPENDENT_AMBULATORY_CARE_PROVIDER_SITE_OTHER): Payer: 59 | Admitting: Family Medicine

## 2022-11-23 VITALS — BP 122/76 | HR 72 | Temp 98.6°F | Ht 66.75 in | Wt 192.2 lb

## 2022-11-23 DIAGNOSIS — M7661 Achilles tendinitis, right leg: Secondary | ICD-10-CM | POA: Diagnosis not present

## 2022-11-23 DIAGNOSIS — M7662 Achilles tendinitis, left leg: Secondary | ICD-10-CM | POA: Diagnosis not present

## 2022-11-23 DIAGNOSIS — M6702 Short Achilles tendon (acquired), left ankle: Secondary | ICD-10-CM | POA: Diagnosis not present

## 2022-11-23 DIAGNOSIS — M7671 Peroneal tendinitis, right leg: Secondary | ICD-10-CM | POA: Diagnosis not present

## 2022-11-23 DIAGNOSIS — M7672 Peroneal tendinitis, left leg: Secondary | ICD-10-CM | POA: Diagnosis not present

## 2022-11-23 DIAGNOSIS — Z789 Other specified health status: Secondary | ICD-10-CM | POA: Diagnosis not present

## 2022-11-23 DIAGNOSIS — R768 Other specified abnormal immunological findings in serum: Secondary | ICD-10-CM

## 2022-11-23 DIAGNOSIS — R29898 Other symptoms and signs involving the musculoskeletal system: Secondary | ICD-10-CM | POA: Diagnosis not present

## 2022-11-23 DIAGNOSIS — Z7409 Other reduced mobility: Secondary | ICD-10-CM | POA: Diagnosis not present

## 2022-11-23 NOTE — Progress Notes (Signed)
Established Patient Office Visit   Subjective  Patient ID: Christine Pacheco, female    DOB: 09-20-1960  Age: 62 y.o. 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  Patient presents afte receiving notification of positive hep C testing at plasma donation center in [place].  Letter advised patient to see PCP and that she would be placed on a national do not donate list.  Per review of test result notification provided by patient: HCV testing on 11/11/2022 was positive, however HCV PCR and HCV supplemental were both negative.  Per Legend on testing results HCV is a primary test while HCV PCR is confirmatory and the other HCV is a supplemental test.  Hepatitis A, B, HIV and syphilis were negative.  Per chart review patient had negative hep C testing 10/26/2021.  Pt denies illicit drug use, tattoos, or sex.    Patient Active Problem List   Diagnosis Date Noted   Erosive esophagitis 09/23/2021   PAF (paroxysmal atrial fibrillation) (HCC) 09/23/2021   Hyperlipidemia 09/23/2021   Insomnia 09/23/2021   Depression 09/23/2021   Past Medical History:  Diagnosis Date   Depression    Erosive esophagitis    Glaucoma    Hyperlipidemia    Hyperlipidemia    Insomnia    Migraine    PAF (paroxysmal atrial fibrillation) (HCC)    Past Surgical History:  Procedure Laterality Date   CHOLECYSTECTOMY     Social History   Tobacco Use   Smoking status: Never   Smokeless tobacco: Never  Substance Use Topics   Alcohol use: Not Currently   Drug use: Not Currently   Family History  Problem Relation Age of Onset   Hyperlipidemia Mother    Glaucoma Mother    Memory loss Mother    Bone cancer Father    Stroke Father    Hypertension Brother    Diabetic kidney disease  Brother    Clotting disorder Brother    Hypertension Maternal Grandmother    Stroke Maternal Grandmother    Heart disease Paternal Grandmother    Cataracts Paternal Grandmother    Heart attack Paternal Grandfather    Allergies  Allergen Reactions   Latex Itching   Alendronate Other (See Comments)    GI upset   Charentais Melon (French Melon) Swelling    Watermelon and cantelope   Egg Shells Swelling   Hydrocodone Other (See Comments)   Ibandronic Acid Other (See Comments)    Myalgias and GI upset   Pneumococcal Vac Polyvalent Other (See Comments)    Patient developed localized reaction to left upper arm.  Small area of redness and swelling at injection site.   Wheat Rash   Codeine Nausea Only   Penicillins Hives and Itching      ROS Negative unless stated above    Objective:     BP 122/76 (BP Location: Left Arm, Patient Position: Sitting, Cuff Size: Large)   Pulse 72   Temp 98.6 F (37 C) (Oral)   Ht 5' 6.75" (1.695 m)   Wt 192 lb 3.2 oz (87.2 kg)   SpO2 99%   BMI 30.33 kg/m  BP Readings from Last 3 Encounters:  11/23/22 122/76  10/31/22 110/80  01/25/22 110/60   Wt Readings from Last  3 Encounters:  11/23/22 192 lb 3.2 oz (87.2 kg)  10/31/22 188 lb (85.3 kg)  01/25/22 166 lb (75.3 kg)      Physical Exam Constitutional:      Appearance: Normal appearance. She is normal weight.  HENT:     Head: Normocephalic and atraumatic.     Nose: Nose normal.     Mouth/Throat:     Mouth: Mucous membranes are moist.  Eyes:     Extraocular Movements: Extraocular movements intact.     Conjunctiva/sclera: Conjunctivae normal.  Cardiovascular:     Rate and Rhythm: Normal rate.  Pulmonary:     Effort: Pulmonary effort is normal.     Breath sounds: Normal breath sounds.  Skin:    General: Skin is warm and dry.  Neurological:     Mental Status: She is alert and oriented to person, place, and time. Mental status is at baseline.    No results found for any visits  on 11/23/22.    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  Also consider a past infection that cleared.  HCV testing in clinic negative 10/26/21.  Offered repeat testing.  Patient declines at this time.  Patient will try notifying plasma center in regards to being allowed to donate in the future and removal from national registry.  Follow-up with PCP.    Return if symptoms worsen or fail to improve.   Deeann Saint, MD

## 2022-11-24 ENCOUNTER — Ambulatory Visit: Payer: 59 | Admitting: Adult Health

## 2022-11-28 DIAGNOSIS — M531 Cervicobrachial syndrome: Secondary | ICD-10-CM | POA: Diagnosis not present

## 2022-11-28 DIAGNOSIS — M9904 Segmental and somatic dysfunction of sacral region: Secondary | ICD-10-CM | POA: Diagnosis not present

## 2022-11-28 DIAGNOSIS — M9903 Segmental and somatic dysfunction of lumbar region: Secondary | ICD-10-CM | POA: Diagnosis not present

## 2022-11-28 DIAGNOSIS — M9901 Segmental and somatic dysfunction of cervical region: Secondary | ICD-10-CM | POA: Diagnosis not present

## 2022-11-28 DIAGNOSIS — M9902 Segmental and somatic dysfunction of thoracic region: Secondary | ICD-10-CM | POA: Diagnosis not present

## 2022-11-30 DIAGNOSIS — M25472 Effusion, left ankle: Secondary | ICD-10-CM | POA: Diagnosis not present

## 2022-12-01 DIAGNOSIS — M9903 Segmental and somatic dysfunction of lumbar region: Secondary | ICD-10-CM | POA: Diagnosis not present

## 2022-12-01 DIAGNOSIS — M9901 Segmental and somatic dysfunction of cervical region: Secondary | ICD-10-CM | POA: Diagnosis not present

## 2022-12-01 DIAGNOSIS — M9904 Segmental and somatic dysfunction of sacral region: Secondary | ICD-10-CM | POA: Diagnosis not present

## 2022-12-01 DIAGNOSIS — M9902 Segmental and somatic dysfunction of thoracic region: Secondary | ICD-10-CM | POA: Diagnosis not present

## 2022-12-01 DIAGNOSIS — M531 Cervicobrachial syndrome: Secondary | ICD-10-CM | POA: Diagnosis not present

## 2022-12-11 DIAGNOSIS — M9904 Segmental and somatic dysfunction of sacral region: Secondary | ICD-10-CM | POA: Diagnosis not present

## 2022-12-11 DIAGNOSIS — M9903 Segmental and somatic dysfunction of lumbar region: Secondary | ICD-10-CM | POA: Diagnosis not present

## 2022-12-11 DIAGNOSIS — M531 Cervicobrachial syndrome: Secondary | ICD-10-CM | POA: Diagnosis not present

## 2022-12-11 DIAGNOSIS — M9901 Segmental and somatic dysfunction of cervical region: Secondary | ICD-10-CM | POA: Diagnosis not present

## 2022-12-11 DIAGNOSIS — M9902 Segmental and somatic dysfunction of thoracic region: Secondary | ICD-10-CM | POA: Diagnosis not present

## 2022-12-12 DIAGNOSIS — M129 Arthropathy, unspecified: Secondary | ICD-10-CM | POA: Diagnosis not present

## 2022-12-12 DIAGNOSIS — M76822 Posterior tibial tendinitis, left leg: Secondary | ICD-10-CM | POA: Diagnosis not present

## 2022-12-12 DIAGNOSIS — M7662 Achilles tendinitis, left leg: Secondary | ICD-10-CM | POA: Diagnosis not present

## 2022-12-12 DIAGNOSIS — M25472 Effusion, left ankle: Secondary | ICD-10-CM | POA: Diagnosis not present

## 2022-12-15 DIAGNOSIS — M9901 Segmental and somatic dysfunction of cervical region: Secondary | ICD-10-CM | POA: Diagnosis not present

## 2022-12-15 DIAGNOSIS — M9903 Segmental and somatic dysfunction of lumbar region: Secondary | ICD-10-CM | POA: Diagnosis not present

## 2022-12-15 DIAGNOSIS — M531 Cervicobrachial syndrome: Secondary | ICD-10-CM | POA: Diagnosis not present

## 2022-12-15 DIAGNOSIS — M9902 Segmental and somatic dysfunction of thoracic region: Secondary | ICD-10-CM | POA: Diagnosis not present

## 2022-12-15 DIAGNOSIS — M9904 Segmental and somatic dysfunction of sacral region: Secondary | ICD-10-CM | POA: Diagnosis not present

## 2022-12-18 DIAGNOSIS — M9901 Segmental and somatic dysfunction of cervical region: Secondary | ICD-10-CM | POA: Diagnosis not present

## 2022-12-18 DIAGNOSIS — M9903 Segmental and somatic dysfunction of lumbar region: Secondary | ICD-10-CM | POA: Diagnosis not present

## 2022-12-18 DIAGNOSIS — M9902 Segmental and somatic dysfunction of thoracic region: Secondary | ICD-10-CM | POA: Diagnosis not present

## 2022-12-18 DIAGNOSIS — M9904 Segmental and somatic dysfunction of sacral region: Secondary | ICD-10-CM | POA: Diagnosis not present

## 2022-12-18 DIAGNOSIS — M531 Cervicobrachial syndrome: Secondary | ICD-10-CM | POA: Diagnosis not present

## 2022-12-20 DIAGNOSIS — M531 Cervicobrachial syndrome: Secondary | ICD-10-CM | POA: Diagnosis not present

## 2022-12-20 DIAGNOSIS — M9902 Segmental and somatic dysfunction of thoracic region: Secondary | ICD-10-CM | POA: Diagnosis not present

## 2022-12-20 DIAGNOSIS — M9904 Segmental and somatic dysfunction of sacral region: Secondary | ICD-10-CM | POA: Diagnosis not present

## 2022-12-20 DIAGNOSIS — M9903 Segmental and somatic dysfunction of lumbar region: Secondary | ICD-10-CM | POA: Diagnosis not present

## 2022-12-20 DIAGNOSIS — M9901 Segmental and somatic dysfunction of cervical region: Secondary | ICD-10-CM | POA: Diagnosis not present

## 2022-12-22 DIAGNOSIS — M9901 Segmental and somatic dysfunction of cervical region: Secondary | ICD-10-CM | POA: Diagnosis not present

## 2022-12-22 DIAGNOSIS — M9903 Segmental and somatic dysfunction of lumbar region: Secondary | ICD-10-CM | POA: Diagnosis not present

## 2022-12-22 DIAGNOSIS — M9904 Segmental and somatic dysfunction of sacral region: Secondary | ICD-10-CM | POA: Diagnosis not present

## 2022-12-22 DIAGNOSIS — M9902 Segmental and somatic dysfunction of thoracic region: Secondary | ICD-10-CM | POA: Diagnosis not present

## 2022-12-22 DIAGNOSIS — M531 Cervicobrachial syndrome: Secondary | ICD-10-CM | POA: Diagnosis not present

## 2022-12-25 DIAGNOSIS — M9904 Segmental and somatic dysfunction of sacral region: Secondary | ICD-10-CM | POA: Diagnosis not present

## 2022-12-25 DIAGNOSIS — M9901 Segmental and somatic dysfunction of cervical region: Secondary | ICD-10-CM | POA: Diagnosis not present

## 2022-12-25 DIAGNOSIS — M9903 Segmental and somatic dysfunction of lumbar region: Secondary | ICD-10-CM | POA: Diagnosis not present

## 2022-12-25 DIAGNOSIS — M9902 Segmental and somatic dysfunction of thoracic region: Secondary | ICD-10-CM | POA: Diagnosis not present

## 2022-12-25 DIAGNOSIS — M531 Cervicobrachial syndrome: Secondary | ICD-10-CM | POA: Diagnosis not present

## 2022-12-27 DIAGNOSIS — M9901 Segmental and somatic dysfunction of cervical region: Secondary | ICD-10-CM | POA: Diagnosis not present

## 2022-12-27 DIAGNOSIS — M9903 Segmental and somatic dysfunction of lumbar region: Secondary | ICD-10-CM | POA: Diagnosis not present

## 2022-12-27 DIAGNOSIS — M9904 Segmental and somatic dysfunction of sacral region: Secondary | ICD-10-CM | POA: Diagnosis not present

## 2022-12-27 DIAGNOSIS — M531 Cervicobrachial syndrome: Secondary | ICD-10-CM | POA: Diagnosis not present

## 2022-12-27 DIAGNOSIS — M9902 Segmental and somatic dysfunction of thoracic region: Secondary | ICD-10-CM | POA: Diagnosis not present

## 2022-12-29 DIAGNOSIS — M79662 Pain in left lower leg: Secondary | ICD-10-CM | POA: Diagnosis not present

## 2022-12-29 DIAGNOSIS — M7662 Achilles tendinitis, left leg: Secondary | ICD-10-CM | POA: Diagnosis not present

## 2022-12-29 DIAGNOSIS — M76822 Posterior tibial tendinitis, left leg: Secondary | ICD-10-CM | POA: Diagnosis not present

## 2022-12-29 DIAGNOSIS — M25472 Effusion, left ankle: Secondary | ICD-10-CM | POA: Diagnosis not present

## 2023-01-01 DIAGNOSIS — M9903 Segmental and somatic dysfunction of lumbar region: Secondary | ICD-10-CM | POA: Diagnosis not present

## 2023-01-01 DIAGNOSIS — M531 Cervicobrachial syndrome: Secondary | ICD-10-CM | POA: Diagnosis not present

## 2023-01-01 DIAGNOSIS — M9901 Segmental and somatic dysfunction of cervical region: Secondary | ICD-10-CM | POA: Diagnosis not present

## 2023-01-01 DIAGNOSIS — M9902 Segmental and somatic dysfunction of thoracic region: Secondary | ICD-10-CM | POA: Diagnosis not present

## 2023-01-01 DIAGNOSIS — M9904 Segmental and somatic dysfunction of sacral region: Secondary | ICD-10-CM | POA: Diagnosis not present

## 2023-01-05 DIAGNOSIS — M9904 Segmental and somatic dysfunction of sacral region: Secondary | ICD-10-CM | POA: Diagnosis not present

## 2023-01-05 DIAGNOSIS — M9902 Segmental and somatic dysfunction of thoracic region: Secondary | ICD-10-CM | POA: Diagnosis not present

## 2023-01-05 DIAGNOSIS — M9903 Segmental and somatic dysfunction of lumbar region: Secondary | ICD-10-CM | POA: Diagnosis not present

## 2023-01-05 DIAGNOSIS — M531 Cervicobrachial syndrome: Secondary | ICD-10-CM | POA: Diagnosis not present

## 2023-01-05 DIAGNOSIS — M9901 Segmental and somatic dysfunction of cervical region: Secondary | ICD-10-CM | POA: Diagnosis not present

## 2023-01-12 DIAGNOSIS — M9901 Segmental and somatic dysfunction of cervical region: Secondary | ICD-10-CM | POA: Diagnosis not present

## 2023-01-12 DIAGNOSIS — M9903 Segmental and somatic dysfunction of lumbar region: Secondary | ICD-10-CM | POA: Diagnosis not present

## 2023-01-12 DIAGNOSIS — M531 Cervicobrachial syndrome: Secondary | ICD-10-CM | POA: Diagnosis not present

## 2023-01-12 DIAGNOSIS — M9902 Segmental and somatic dysfunction of thoracic region: Secondary | ICD-10-CM | POA: Diagnosis not present

## 2023-01-12 DIAGNOSIS — M9904 Segmental and somatic dysfunction of sacral region: Secondary | ICD-10-CM | POA: Diagnosis not present

## 2023-01-16 DIAGNOSIS — M531 Cervicobrachial syndrome: Secondary | ICD-10-CM | POA: Diagnosis not present

## 2023-01-16 DIAGNOSIS — M9902 Segmental and somatic dysfunction of thoracic region: Secondary | ICD-10-CM | POA: Diagnosis not present

## 2023-01-16 DIAGNOSIS — M9903 Segmental and somatic dysfunction of lumbar region: Secondary | ICD-10-CM | POA: Diagnosis not present

## 2023-01-16 DIAGNOSIS — M9904 Segmental and somatic dysfunction of sacral region: Secondary | ICD-10-CM | POA: Diagnosis not present

## 2023-01-16 DIAGNOSIS — M9901 Segmental and somatic dysfunction of cervical region: Secondary | ICD-10-CM | POA: Diagnosis not present

## 2023-01-18 DIAGNOSIS — G4700 Insomnia, unspecified: Secondary | ICD-10-CM | POA: Diagnosis not present

## 2023-01-18 DIAGNOSIS — F411 Generalized anxiety disorder: Secondary | ICD-10-CM | POA: Diagnosis not present

## 2023-01-18 DIAGNOSIS — F331 Major depressive disorder, recurrent, moderate: Secondary | ICD-10-CM | POA: Diagnosis not present

## 2023-01-29 DIAGNOSIS — M9901 Segmental and somatic dysfunction of cervical region: Secondary | ICD-10-CM | POA: Diagnosis not present

## 2023-01-29 DIAGNOSIS — M531 Cervicobrachial syndrome: Secondary | ICD-10-CM | POA: Diagnosis not present

## 2023-01-29 DIAGNOSIS — M9903 Segmental and somatic dysfunction of lumbar region: Secondary | ICD-10-CM | POA: Diagnosis not present

## 2023-01-29 DIAGNOSIS — M9904 Segmental and somatic dysfunction of sacral region: Secondary | ICD-10-CM | POA: Diagnosis not present

## 2023-01-29 DIAGNOSIS — M9902 Segmental and somatic dysfunction of thoracic region: Secondary | ICD-10-CM | POA: Diagnosis not present

## 2023-01-30 ENCOUNTER — Other Ambulatory Visit: Payer: Self-pay | Admitting: Adult Health

## 2023-01-30 DIAGNOSIS — Z8669 Personal history of other diseases of the nervous system and sense organs: Secondary | ICD-10-CM

## 2023-01-31 DIAGNOSIS — M9901 Segmental and somatic dysfunction of cervical region: Secondary | ICD-10-CM | POA: Diagnosis not present

## 2023-01-31 DIAGNOSIS — M531 Cervicobrachial syndrome: Secondary | ICD-10-CM | POA: Diagnosis not present

## 2023-01-31 DIAGNOSIS — M9902 Segmental and somatic dysfunction of thoracic region: Secondary | ICD-10-CM | POA: Diagnosis not present

## 2023-01-31 DIAGNOSIS — M9904 Segmental and somatic dysfunction of sacral region: Secondary | ICD-10-CM | POA: Diagnosis not present

## 2023-01-31 DIAGNOSIS — M9903 Segmental and somatic dysfunction of lumbar region: Secondary | ICD-10-CM | POA: Diagnosis not present

## 2023-01-31 NOTE — Telephone Encounter (Signed)
Okay for refill?  

## 2023-02-01 DIAGNOSIS — M9904 Segmental and somatic dysfunction of sacral region: Secondary | ICD-10-CM | POA: Diagnosis not present

## 2023-02-01 DIAGNOSIS — M9902 Segmental and somatic dysfunction of thoracic region: Secondary | ICD-10-CM | POA: Diagnosis not present

## 2023-02-01 DIAGNOSIS — M9903 Segmental and somatic dysfunction of lumbar region: Secondary | ICD-10-CM | POA: Diagnosis not present

## 2023-02-01 DIAGNOSIS — M531 Cervicobrachial syndrome: Secondary | ICD-10-CM | POA: Diagnosis not present

## 2023-02-01 DIAGNOSIS — M9901 Segmental and somatic dysfunction of cervical region: Secondary | ICD-10-CM | POA: Diagnosis not present

## 2023-02-07 DIAGNOSIS — M9904 Segmental and somatic dysfunction of sacral region: Secondary | ICD-10-CM | POA: Diagnosis not present

## 2023-02-07 DIAGNOSIS — M9902 Segmental and somatic dysfunction of thoracic region: Secondary | ICD-10-CM | POA: Diagnosis not present

## 2023-02-07 DIAGNOSIS — M531 Cervicobrachial syndrome: Secondary | ICD-10-CM | POA: Diagnosis not present

## 2023-02-07 DIAGNOSIS — M9901 Segmental and somatic dysfunction of cervical region: Secondary | ICD-10-CM | POA: Diagnosis not present

## 2023-02-07 DIAGNOSIS — M9903 Segmental and somatic dysfunction of lumbar region: Secondary | ICD-10-CM | POA: Diagnosis not present

## 2023-02-12 DIAGNOSIS — M9902 Segmental and somatic dysfunction of thoracic region: Secondary | ICD-10-CM | POA: Diagnosis not present

## 2023-02-12 DIAGNOSIS — M531 Cervicobrachial syndrome: Secondary | ICD-10-CM | POA: Diagnosis not present

## 2023-02-12 DIAGNOSIS — M9904 Segmental and somatic dysfunction of sacral region: Secondary | ICD-10-CM | POA: Diagnosis not present

## 2023-02-12 DIAGNOSIS — M9903 Segmental and somatic dysfunction of lumbar region: Secondary | ICD-10-CM | POA: Diagnosis not present

## 2023-02-12 DIAGNOSIS — M9901 Segmental and somatic dysfunction of cervical region: Secondary | ICD-10-CM | POA: Diagnosis not present

## 2023-02-16 DIAGNOSIS — M9904 Segmental and somatic dysfunction of sacral region: Secondary | ICD-10-CM | POA: Diagnosis not present

## 2023-02-16 DIAGNOSIS — M9902 Segmental and somatic dysfunction of thoracic region: Secondary | ICD-10-CM | POA: Diagnosis not present

## 2023-02-16 DIAGNOSIS — M9901 Segmental and somatic dysfunction of cervical region: Secondary | ICD-10-CM | POA: Diagnosis not present

## 2023-02-16 DIAGNOSIS — M9903 Segmental and somatic dysfunction of lumbar region: Secondary | ICD-10-CM | POA: Diagnosis not present

## 2023-02-16 DIAGNOSIS — M531 Cervicobrachial syndrome: Secondary | ICD-10-CM | POA: Diagnosis not present

## 2023-02-19 DIAGNOSIS — M9904 Segmental and somatic dysfunction of sacral region: Secondary | ICD-10-CM | POA: Diagnosis not present

## 2023-02-19 DIAGNOSIS — M9901 Segmental and somatic dysfunction of cervical region: Secondary | ICD-10-CM | POA: Diagnosis not present

## 2023-02-19 DIAGNOSIS — M9902 Segmental and somatic dysfunction of thoracic region: Secondary | ICD-10-CM | POA: Diagnosis not present

## 2023-02-19 DIAGNOSIS — M9903 Segmental and somatic dysfunction of lumbar region: Secondary | ICD-10-CM | POA: Diagnosis not present

## 2023-02-19 DIAGNOSIS — M531 Cervicobrachial syndrome: Secondary | ICD-10-CM | POA: Diagnosis not present

## 2023-02-23 DIAGNOSIS — M9901 Segmental and somatic dysfunction of cervical region: Secondary | ICD-10-CM | POA: Diagnosis not present

## 2023-02-23 DIAGNOSIS — M9902 Segmental and somatic dysfunction of thoracic region: Secondary | ICD-10-CM | POA: Diagnosis not present

## 2023-02-23 DIAGNOSIS — M9904 Segmental and somatic dysfunction of sacral region: Secondary | ICD-10-CM | POA: Diagnosis not present

## 2023-02-23 DIAGNOSIS — M9903 Segmental and somatic dysfunction of lumbar region: Secondary | ICD-10-CM | POA: Diagnosis not present

## 2023-02-23 DIAGNOSIS — M531 Cervicobrachial syndrome: Secondary | ICD-10-CM | POA: Diagnosis not present

## 2023-02-27 DIAGNOSIS — M9902 Segmental and somatic dysfunction of thoracic region: Secondary | ICD-10-CM | POA: Diagnosis not present

## 2023-02-27 DIAGNOSIS — M9901 Segmental and somatic dysfunction of cervical region: Secondary | ICD-10-CM | POA: Diagnosis not present

## 2023-02-27 DIAGNOSIS — M531 Cervicobrachial syndrome: Secondary | ICD-10-CM | POA: Diagnosis not present

## 2023-02-27 DIAGNOSIS — M9904 Segmental and somatic dysfunction of sacral region: Secondary | ICD-10-CM | POA: Diagnosis not present

## 2023-02-27 DIAGNOSIS — M9903 Segmental and somatic dysfunction of lumbar region: Secondary | ICD-10-CM | POA: Diagnosis not present

## 2023-03-02 DIAGNOSIS — M9901 Segmental and somatic dysfunction of cervical region: Secondary | ICD-10-CM | POA: Diagnosis not present

## 2023-03-02 DIAGNOSIS — M531 Cervicobrachial syndrome: Secondary | ICD-10-CM | POA: Diagnosis not present

## 2023-03-02 DIAGNOSIS — M9904 Segmental and somatic dysfunction of sacral region: Secondary | ICD-10-CM | POA: Diagnosis not present

## 2023-03-02 DIAGNOSIS — M9902 Segmental and somatic dysfunction of thoracic region: Secondary | ICD-10-CM | POA: Diagnosis not present

## 2023-03-02 DIAGNOSIS — M9903 Segmental and somatic dysfunction of lumbar region: Secondary | ICD-10-CM | POA: Diagnosis not present

## 2023-05-08 ENCOUNTER — Other Ambulatory Visit: Payer: Self-pay | Admitting: Adult Health

## 2023-05-08 DIAGNOSIS — Z8669 Personal history of other diseases of the nervous system and sense organs: Secondary | ICD-10-CM

## 2023-05-09 NOTE — Telephone Encounter (Signed)
 Pt has an appt. For the end of the year.  Ok to fill?

## 2023-08-15 ENCOUNTER — Other Ambulatory Visit: Payer: Self-pay | Admitting: Adult Health

## 2023-08-15 DIAGNOSIS — Z8669 Personal history of other diseases of the nervous system and sense organs: Secondary | ICD-10-CM

## 2023-08-16 NOTE — Telephone Encounter (Signed)
 Okay for refill?

## 2023-10-31 NOTE — Progress Notes (Deleted)
 Subjective:    Patient ID: Christine Pacheco, female    DOB: 04/23/1960, 63 y.o.   MRN: [id number]  HPI Patient presents for yearly preventative medicine examination. He is a pleasant 63 year old female who  has a past medical history of Depression, Erosive esophagitis, Glaucoma, Hyperlipidemia, Hyperlipidemia, Insomnia, Migraine, and PAF (paroxysmal atrial fibrillation) (HCC).  Erosive esophagitis-is managed by gastroenterology at [facility].  She had an EGD done in May 2023 which revealed grade C erosive esophagitis without bleeding, single submucosal papule/nodule in the stomach and nonbleeding duodenal ulcer with no stigmata of bleeding.  She was also tested and was negative for H. pylori.  She is currently managed with Nexium 40 mg twice daily and Dexilant 60 mg twice daily.  She does use liquid Carafate  and a GI cocktail with lidocaine  as needed.  Recently she was seen by gastroenterology on 10/10/2021 after a trial of baclofen 10 mg 3 times daily.  She reported that aside from occasional drowsiness her symptoms have improved.She does have alternating bowel habits.   PAF-managed by cardiology at [facility].  She is currently prescribed metoprolol 50 mg twice daily and Eliquis 5 mg BID. She was on Multaq in the past but could no longer afford it. She was also on Flecanide in the past but this was stopped by Cardiology due to coronary calcifications. Denies bleeding issues. Has intermittent chest pain/palpitations.    Hyperlipidemia-managed with Zetia and fish oil.  In the past she could not tolerate statins so she stopped them.  She was also on Repatha but could not addord it in the past. Earlier this year Cardiology restarted her on Repatha.   Lab Results  Component Value Date   CHOL 259 (H) 10/31/2022   HDL 50.30 10/31/2022   LDLCALC 164 (H) 10/31/2022   LDLDIRECT 150.0 10/26/2021   TRIG 226.0 (H) 10/31/2022   CHOLHDL 5 10/31/2022    Insomnia-managed by psychiatry, takes Ambien 10 mg CR  nightly.  She does report that this helps her sleep well  Depression-managed by psychiatry, currently prescribed Effexor 75 mg extended release daily.  She does feel as though this controls her symptoms well  Migraine headaches-she reports failing multiple medications including Aimovig, propanolol, Nurtec, botox,  and Topamax.  She currently takes Fioricet as needed. She varies between 1-5 migraines a month..   Lower extremity edema-has a prescription for Lasix 20 mg daily as needed but does not feel as though this helps much.  She does have some edema bilateral every day in which she believes elevation helps more the medication  Prediabetes - not currently on medication. Was able to lower her A1c with lifestyle modifications.  Lab Results  Component Value Date   HGBA1C 6.0 10/31/2022   HGBA1C 5.8 (A) 01/25/2022   HGBA1C 6.2 10/26/2021   Osteoporosis - managed with Prolia injections Q6 months.   All immunizations and health maintenance protocols were reviewed with the patient and needed orders were placed.  Appropriate screening laboratory values were ordered for the patient including screening of hyperlipidemia, renal function and hepatic function.  Medication reconciliation,  past medical history, social history, problem list and allergies were reviewed in detail with the patient  Goals were established with regard to weight loss, exercise, and  diet in compliance with medications Wt Readings from Last 3 Encounters:  11/23/22 192 lb 3.2 oz (87.2 kg)  10/31/22 188 lb (85.3 kg)  01/25/22 166 lb (75.3 kg)   She is up to date on routine colon  cancer screening, her last colonoscopy was 08/13/2023.  Review of Systems  Constitutional: Negative.   HENT: Negative.    Eyes: Negative.   Respiratory: Negative.    Cardiovascular: Negative.   Gastrointestinal: Negative.   Endocrine: Negative.   Genitourinary: Negative.   Musculoskeletal: Negative.   Skin: Negative.   Allergic/Immunologic:  Negative.   Neurological: Negative.   Hematological: Negative.   Psychiatric/Behavioral: Negative.     Past Medical History:  Diagnosis Date   Depression    Erosive esophagitis    Glaucoma    Hyperlipidemia    Hyperlipidemia    Insomnia    Migraine    PAF (paroxysmal atrial fibrillation) (HCC)     Social History   Socioeconomic History   Marital status: Single    Spouse name: Not on file   Number of children: Not on file   Years of education: Not on file   Highest education level: Not on file  Occupational History   Not on file  Tobacco Use   Smoking status: Never   Smokeless tobacco: Never  Vaping Use   Vaping status: Not on file  Substance and Sexual Activity   Alcohol use: Not Currently   Drug use: Not Currently   Sexual activity: Not on file  Other Topics Concern   Not on file  Social History Narrative   She is a Air cabin crew    Social Drivers of Health   Financial Resource Strain: Low Risk  (08/10/2023)   Received from [facility]   Overall Financial Resource Strain (CARDIA)    How hard is it for you to pay for the very basics like food, housing, medical care, and heating?: Not hard at all  Food Insecurity: No Food Insecurity (08/10/2023)   Received from [facility]   Hunger Vital Sign    Within the past 12 months, you worried that your food would run out before you got the money to buy more.: Never true    Within the past 12 months, the food you bought just didn't last and you didn't have money to get more.: Never true  Transportation Needs: No Transportation Needs (08/10/2023)   Received from [facility] - Transportation    In the past 12 months, has lack of transportation kept you from medical appointments or from getting medications?: No    In the past 12 months, has lack of transportation kept you from meetings, work, or from getting things needed for daily living?: No  Physical Activity: Not on file  Stress: Not on file  Social  Connections: Unknown (05/09/2021)   Received from [facility]   Social Network    Social Network: Not on file  Intimate Partner Violence: Not At Risk (09/11/2022)   Received from [facility]   HITS    Over the last 12 months how often did your partner physically hurt you?: Never    Over the last 12 months how often did your partner insult you or talk down to you?: Never    Over the last 12 months how often did your partner threaten you with physical harm?: Never    Over the last 12 months how often did your partner scream or curse at you?: Never    Past Surgical History:  Procedure Laterality Date   CHOLECYSTECTOMY      Family History  Problem Relation Age of Onset   Hyperlipidemia Mother    Glaucoma Mother    Memory loss Mother    Bone  cancer Father    Stroke Father    Hypertension Brother    Diabetic kidney disease Brother    Clotting disorder Brother    Hypertension Maternal Grandmother    Stroke Maternal Grandmother    Heart disease Paternal Grandmother    Cataracts Paternal Grandmother    Heart attack Paternal Grandfather     Allergies  Allergen Reactions   Latex Itching   Alendronate Other (See Comments)    GI upset   Charentais Melon (French Melon) Swelling    Watermelon and cantelope   Egg Shells Swelling   Hydrocodone Other (See Comments)   Ibandronate Other (See Comments)    Myalgias and GI upset   Pneumococcal Vac Polyvalent Other (See Comments)    Patient developed localized reaction to left upper arm.  Small area of redness and swelling at injection site.   Wheat Rash   Codeine Nausea Only   Penicillins Hives and Itching    Current Outpatient Medications on File Prior to Visit  Medication Sig Dispense Refill   baclofen (LIORESAL) 10 MG tablet Take 10 mg by mouth 3 (three) times daily.     butalbital -acetaminophen-caffeine  (FIORICET) 50-325-40 MG tablet TAKE 1 TO 2 TABLETS BY MOUTH EVERY 6 HOURS AS NEEDED 30 tablet 2   dorzolamide-timolol (COSOPT)  2-0.5 % ophthalmic solution 1 drop 2 (two) times daily.     dronedarone (MULTAQ) 400 MG tablet Take 200 mg by mouth. BID     esomeprazole (NEXIUM) 40 MG capsule Take by mouth.     ezetimibe (ZETIA) 10 MG tablet Take 10 mg by mouth daily.     famotidine  (PEPCID ) 40 MG tablet as directed Orally     fexofenadine (ALLEGRA) 180 MG tablet Take by mouth.     fish oil-omega-3 fatty acids 1000 MG capsule Take by mouth.     latanoprost (XALATAN) 0.005 % ophthalmic solution SMARTSIG:In Eye(s)     LORazepam (ATIVAN) 0.5 MG tablet Take 1 mg by mouth 2 (two) times daily as needed.     metoprolol tartrate (LOPRESSOR) 25 MG tablet Take 1/2 (one-half) tablet by mouth twice daily     Multiple Vitamin (THERA) TABS Take 1 tablet by mouth daily.     ondansetron  (ZOFRAN ) 8 MG tablet Take by mouth.     pimecrolimus (ELIDEL) 1 % cream SMARTSIG:Sparingly Topical Twice Daily     PROBIOTIC PRODUCT PO Take 1 capsule by mouth daily.     promethazine (PHENERGAN) 12.5 MG tablet Take 1 or 2 tabs as needed for nausea up to three times a day     venlafaxine XR (EFFEXOR-XR) 37.5 MG 24 hr capsule Take 37.5 mg by mouth daily.     Vitamin D, Ergocalciferol, (DRISDOL) 1.25 MG (50000 UNIT) CAPS capsule Take 50,000 Units by mouth once a week.     zolpidem (AMBIEN CR) 12.5 MG CR tablet Take 12.5 mg by mouth at bedtime.     No current facility-administered medications on file prior to visit.    There were no vitals taken for this visit.      Objective:   Physical Exam Vitals and nursing note reviewed.  Constitutional:      General: She is not in acute distress.    Appearance: Normal appearance. She is not ill-appearing.  HENT:     Head: Normocephalic and atraumatic.     Right Ear: Tympanic membrane, ear canal and external ear normal. There is no impacted cerumen.     Left Ear: Tympanic membrane, ear canal and external  ear normal. There is no impacted cerumen.     Nose: Nose normal. No congestion or rhinorrhea.      Mouth/Throat:     Mouth: Mucous membranes are moist.     Pharynx: Oropharynx is clear.  Eyes:     Extraocular Movements: Extraocular movements intact.     Conjunctiva/sclera: Conjunctivae normal.     Pupils: Pupils are equal, round, and reactive to light.  Neck:     Vascular: No carotid bruit.  Cardiovascular:     Rate and Rhythm: Normal rate and regular rhythm.     Pulses: Normal pulses.     Heart sounds: No murmur heard.    No friction rub. No gallop.  Pulmonary:     Effort: Pulmonary effort is normal.     Breath sounds: Normal breath sounds.  Abdominal:     General: Abdomen is flat. Bowel sounds are normal. There is no distension.     Palpations: Abdomen is soft. There is no mass.     Tenderness: There is no abdominal tenderness. There is no guarding or rebound.     Hernia: No hernia is present.  Musculoskeletal:        General: Normal range of motion.     Cervical back: Normal range of motion and neck supple.  Lymphadenopathy:     Cervical: No cervical adenopathy.  Skin:    General: Skin is warm and dry.     Capillary Refill: Capillary refill takes less than 2 seconds.  Neurological:     General: No focal deficit present.     Mental Status: She is alert and oriented to person, place, and time.  Psychiatric:        Mood and Affect: Mood normal.        Behavior: Behavior normal.        Thought Content: Thought content normal.        Judgment: Judgment normal.       Assessment & Plan:

## 2023-11-01 ENCOUNTER — Encounter: Payer: 59 | Admitting: Adult Health

## 2023-12-27 ENCOUNTER — Encounter: Payer: Self-pay | Admitting: Adult Health

## 2023-12-27 ENCOUNTER — Ambulatory Visit: Payer: Self-pay | Admitting: Adult Health

## 2023-12-27 VITALS — BP 92/60 | HR 63 | Temp 97.9°F | Ht 66.75 in | Wt 202.0 lb

## 2023-12-27 DIAGNOSIS — I480 Paroxysmal atrial fibrillation: Secondary | ICD-10-CM | POA: Diagnosis not present

## 2023-12-27 DIAGNOSIS — R600 Localized edema: Secondary | ICD-10-CM | POA: Diagnosis not present

## 2023-12-27 DIAGNOSIS — Z713 Dietary counseling and surveillance: Secondary | ICD-10-CM | POA: Diagnosis not present

## 2023-12-27 DIAGNOSIS — E782 Mixed hyperlipidemia: Secondary | ICD-10-CM | POA: Diagnosis not present

## 2023-12-27 DIAGNOSIS — K2210 Ulcer of esophagus without bleeding: Secondary | ICD-10-CM

## 2023-12-27 DIAGNOSIS — F32A Depression, unspecified: Secondary | ICD-10-CM

## 2023-12-27 DIAGNOSIS — F5101 Primary insomnia: Secondary | ICD-10-CM | POA: Diagnosis not present

## 2023-12-27 DIAGNOSIS — R7303 Prediabetes: Secondary | ICD-10-CM

## 2023-12-27 DIAGNOSIS — Z0000 Encounter for general adult medical examination without abnormal findings: Secondary | ICD-10-CM

## 2023-12-27 LAB — CBC WITH DIFFERENTIAL/PLATELET
Basophils Absolute: 0 K/uL (ref 0.0–0.1)
Basophils Relative: 0.2 % (ref 0.0–3.0)
Eosinophils Absolute: 0.1 K/uL (ref 0.0–0.7)
Eosinophils Relative: 1.1 % (ref 0.0–5.0)
HCT: 38.5 % (ref 36.0–46.0)
Hemoglobin: 12.8 g/dL (ref 12.0–15.0)
Lymphocytes Relative: 35 % (ref 12.0–46.0)
Lymphs Abs: 2.6 K/uL (ref 0.7–4.0)
MCHC: 33.2 g/dL (ref 30.0–36.0)
MCV: 98.3 fl (ref 78.0–100.0)
Monocytes Absolute: 0.6 K/uL (ref 0.1–1.0)
Monocytes Relative: 8.3 % (ref 3.0–12.0)
Neutro Abs: 4.2 K/uL (ref 1.4–7.7)
Neutrophils Relative %: 55.4 % (ref 43.0–77.0)
Platelets: 327 K/uL (ref 150.0–400.0)
RBC: 3.92 Mil/uL (ref 3.87–5.11)
RDW: 14.3 % (ref 11.5–15.5)
WBC: 7.5 K/uL (ref 4.0–10.5)

## 2023-12-27 LAB — COMPREHENSIVE METABOLIC PANEL WITH GFR
ALT: 22 U/L (ref 3–35)
AST: 21 U/L (ref 5–37)
Albumin: 4.3 g/dL (ref 3.5–5.2)
Alkaline Phosphatase: 94 U/L (ref 39–117)
BUN: 21 mg/dL (ref 6–23)
CO2: 28 meq/L (ref 19–32)
Calcium: 8.4 mg/dL (ref 8.4–10.5)
Chloride: 105 meq/L (ref 96–112)
Creatinine, Ser: 0.72 mg/dL (ref 0.40–1.20)
GFR: 87.55 mL/min (ref >60.00)
Glucose, Bld: 108 mg/dL — ABNORMAL HIGH (ref 70–99)
Potassium: 3.8 meq/L (ref 3.5–5.1)
Sodium: 142 meq/L (ref 135–145)
Total Bilirubin: 0.4 mg/dL (ref 0.2–1.2)
Total Protein: 6.9 g/dL (ref 6.0–8.3)

## 2023-12-27 LAB — LIPID PANEL
Cholesterol: 165 mg/dL (ref 28–200)
HDL: 47.1 mg/dL (ref >39.00)
LDL Cholesterol: 70 mg/dL (ref 10–99)
NonHDL: 117.63
Total CHOL/HDL Ratio: 3
Triglycerides: 236 mg/dL — ABNORMAL HIGH (ref 10.0–149.0)
VLDL: 47.2 mg/dL — ABNORMAL HIGH (ref 0.0–40.0)

## 2023-12-27 LAB — HEMOGLOBIN A1C: Hgb A1c MFr Bld: 6.4 % (ref 4.6–6.5)

## 2023-12-27 LAB — TSH: TSH: 4.19 u[IU]/mL (ref 0.35–5.50)

## 2023-12-27 MED ORDER — TOPIRAMATE 25 MG PO TABS: 25.0000 mg | ORAL_TABLET | Freq: Every day | ORAL | 0 refills | Status: AC

## 2023-12-27 NOTE — Patient Instructions (Signed)
 Christine Pacheco,  Thank you for taking the time for your Medicare Wellness Visit. I appreciate your continued commitment to your health goals. Please review the care plan we discussed, and feel free to reach out if I can assist you further.  Please note that Annual Wellness Visits do not include a physical exam. Some assessments may be limited, especially if the visit was conducted virtually. If needed, we may recommend an in-person follow-up with your provider.   Referrals If a referral was made during today's visit and you haven't received any updates within two weeks, please contact the referred provider directly to check on the status.  Recommended Screenings:  Health Maintenance  Topic Date Due   Medicare Annual Wellness Visit  Never done   Pneumococcal Vaccine for age over 61 (2 of 2 - PCV) 05/17/2018   Osteoporosis screening with Bone Density Scan  Never done   Flu Shot  08/10/2023   COVID-19 Vaccine (4 - 2025-26 season) 09/10/2023   Pap with HPV screening  09/17/2023   Breast Cancer Screening  06/14/2024   DTaP/Tdap/Td vaccine (2 - Td or Tdap) 08/16/2025   Colon Cancer Screening  02/09/2026   Hepatitis C Screening  Completed   HIV Screening  Completed   Zoster (Shingles) Vaccine  Completed   Hepatitis B Vaccine  Aged Out   Meningitis B Vaccine  Aged Out       05/08/2021    1:12 PM  Advanced Directives  Does Patient Have a Medical Advance Directive? No    Vision: Annual vision screenings are recommended for early detection of glaucoma, cataracts, and diabetic retinopathy. These exams can also reveal signs of chronic conditions such as diabetes and high blood pressure.  Dental: Annual dental screenings help detect early signs of oral cancer, gum disease, and other conditions linked to overall health, including heart disease and diabetes.

## 2023-12-27 NOTE — Progress Notes (Deleted)
° °  Subjective:    Patient ID: Christine Pacheco, female    DOB: 1960/02/06, 63 y.o.   MRN: [id number]  HPI    Review of Systems     Objective:   Physical Exam        Assessment & Plan:

## 2023-12-27 NOTE — Progress Notes (Signed)
 Annual Wellness Visit     Patient: Christine Pacheco, Female    DOB: 12/13/1960, 63 y.o.   MRN: [id number]  Subjective  Chief Complaint  Patient presents with   Medicare Wellness    Christine Pacheco is a 63 y.o. female who presents today for her Annual Wellness Visit. She reports consuming a general diet. The patient does not participate in regular exercise at present. She generally feels well. She reports sleeping well. She does have additional problems to discuss today.   Weight loss Management - she reports that she went to [facility] weight loss center and was prescribed Semaglutide but that this  put her in the hospital. She would like to try something else to help her lose weight. It does not sound like she is doing much in the way of healthy eating or exercise at this point. She has used Topamax  in the past.   She also has a rash on her forehead and around her nose that she plans on seeing Dermatology about - this is just a FY   She also has ringing in her ears and plans to make an appointment with ENT - again this is a FYI   She has pain and a locking sensation in her right hand/pointer finger when she uses the mouse for her computer. She feels as though the right pointer finger curls under and gets stuck - she is going to make an appointment with her orthopedic doctor for this   Chronically her medical history includes:  Erosive esophagitis-is managed by gastroenterology at [facility].  She had an EGD done in May 2023 which revealed grade C erosive esophagitis without bleeding, single submucosal papule/nodule in the stomach and nonbleeding duodenal ulcer with no stigmata of bleeding.  She was also tested and was negative for H. pylori.  She is currently managed with Nexium 40 mg twice daily and Dexilant 60 mg twice daily.  She does use liquid Carafate  and a GI cocktail with lidocaine  as needed.  Recently she was seen by gastroenterology on 10/10/2021 after a trial of baclofen 10 mg 3 times  daily.  She reported that aside from occasional drowsiness her symptoms have improved.She does have alternating bowel habits.   PAF-managed by cardiology at [facility].  She is currently prescribed Multaq 100 mg twice daily and metoprolol 12.5 mg twice daily. She does take Plavix 75 mg daily. Denies bleeding issues. Has intermittent chest pain/palpitations. She has not been taking her Plavix due to fatigue.   Hyperlipidemia-managed with Zetia and fish oil.  In the past she could not tolerate statins so she stopped them.  She was also on Repatha but could not addord it.   Insomnia-managed by psychiatry, takes Ambien 10 mg CR nightly.  She does report that this helps her sleep well  Depression-managed by psychiatry, currently prescribed Effexor 75 mg extended release daily.  She does feel as though this controls her symptoms well  Migraine headaches-she reports failing multiple medications including Aimovig, propanolol, Nurtec, botox,  and Topamax .  She currently takes Fioricet as needed. She varies between 1-5 migraines a month..   Lower extremity edema-has a prescription for Lasix 20 mg daily as needed but does not feel as though this helps much.  She does have some edema bilateral every day in which she believes elevation helps more the medication  Prediabetes - not currently on medication. Was able to lower her A1c was 6.2 to 5.8 with lifestyle modifications     HPI  Patient Active Problem List   Diagnosis Date Noted   Erosive esophagitis 09/23/2021   PAF (paroxysmal atrial fibrillation) (HCC) 09/23/2021   Hyperlipidemia 09/23/2021   Insomnia 09/23/2021   Depression 09/23/2021      Medications: Show/hide medication list[1]  Allergies[2]  Patient Care Team: Merna Huxley, NP as PCP - General (Family Medicine)  Review of Systems  Constitutional: Negative.   HENT:  Positive for hearing loss and tinnitus.   Eyes: Negative.   Respiratory: Negative.    Cardiovascular:  Negative.   Gastrointestinal:  Positive for abdominal pain and heartburn.  Genitourinary: Negative.   Musculoskeletal:  Positive for back pain.  Skin:  Positive for rash.  Neurological: Negative.   Psychiatric/Behavioral:  Positive for depression. The patient is nervous/anxious. The patient does not have insomnia.         Objective  BP 92/60   Pulse 63   Temp 97.9 F (36.6 C) (Oral)   Ht 5' 6.75 (1.695 m)   Wt 202 lb (91.6 kg)   SpO2 98%   BMI 31.88 kg/m    Physical Exam Vitals and nursing note reviewed.  Constitutional:      Appearance: Normal appearance. She is obese.  Cardiovascular:     Rate and Rhythm: Normal rate and regular rhythm.     Pulses: Normal pulses.     Heart sounds: Normal heart sounds.  Pulmonary:     Effort: Pulmonary effort is normal.     Breath sounds: Normal breath sounds.  Abdominal:     General: Abdomen is flat. Bowel sounds are normal.     Palpations: Abdomen is soft.  Musculoskeletal:        General: Normal range of motion.     Right hand: Normal.     Comments: No clicking or popping sensation to right pointer finger. Has full ROM  Skin:    General: Skin is warm and dry.     Findings: Rash (across forhead) present.  Neurological:     General: No focal deficit present.     Mental Status: She is alert and oriented to person, place, and time.  Psychiatric:        Mood and Affect: Mood normal.        Behavior: Behavior normal.        Thought Content: Thought content normal.        Judgment: Judgment normal.     Most recent depression screenings:    12/27/2023   11:07 AM 11/23/2022    3:30 PM  PHQ 2/9 Scores  PHQ - 2 Score 0 2  PHQ- 9 Score 7 9      Data saved with a previous flowsheet row definition    No data recorded  Vision/Hearing Screen: Hearing Screening   500Hz  1000Hz  2000Hz  3000Hz  4000Hz  5000Hz   Right ear Pass Pass Pass Pass Pass Pass  Left ear Pass Pass Pass Pass Pass Pass   Vision Screening   Right eye Left  eye Both eyes  Without correction     With correction 20/50 20/50 20/40       No results found for any visits on 12/27/23.    Assessment & Plan   Annual wellness visit done today including the all of the following: Reviewed patient's Family and Medical History Reviewed and updated list of patient's medical providers Assessment of cognitive impairment was done Assessed patient's functional ability Established a written schedule for health screening services Health Risk Assessent Completed and Reviewed  Exercise Activities and Dietary recommendations  Goals   None     Immunization History  Administered Date(s) Administered   HPV Quadrivalent 09/07/2018   Influenza Inj Mdck Quad Pf 09/07/2018   Influenza, Seasonal, Injecte, Preservative Fre 10/31/2022   Influenza,inj,Quad PF,6+ Mos 10/13/2015, 10/27/2016   Influenza,trivalent, recombinat, inj, PF 10/24/2014   Moderna Sars-Covid-2 Vaccination 05/02/2019, 05/29/2019, 12/27/2019   Pneumococcal Polysaccharide-23 05/16/2017   Tdap 08/17/2015   Zoster Recombinant(Shingrix) 11/19/2021, 02/11/2022    Health Maintenance  Topic Date Due   Pneumococcal Vaccine: 50+ Years (2 of 2 - PCV) 05/17/2018   Bone Density Scan  Never done   Influenza Vaccine  08/10/2023   COVID-19 Vaccine (4 - 2025-26 season) 09/10/2023   Cervical Cancer Screening (HPV/Pap Cotest)  09/17/2023   Mammogram  06/14/2024   Medicare Annual Wellness (AWV)  12/26/2024   DTaP/Tdap/Td (2 - Td or Tdap) 08/16/2025   Colonoscopy  02/09/2026   Hepatitis C Screening  Completed   HIV Screening  Completed   Zoster Vaccines- Shingrix  Completed   Hepatitis B Vaccines 19-59 Average Risk  Aged Out   Meningococcal B Vaccine  Aged Out     Discussed health benefits of physical activity, and encouraged her to engage in regular exercise appropriate for her age and condition.    Problem List Items Addressed This Visit     Depression   Relevant Medications   buPROPion  (WELLBUTRIN XL) 150 MG 24 hr tablet   LORazepam (ATIVAN) 1 MG tablet   Other Relevant Orders   CBC with Differential/Platelet   Comprehensive metabolic panel with GFR   Lipid panel   TSH   VAS US  [facility]A MEDICARE SCREEN   Erosive esophagitis   Relevant Orders   CBC with Differential/Platelet   Comprehensive metabolic panel with GFR   Lipid panel   TSH   VAS US  [facility]ORTA MEDICARE SCREEN   Hyperlipidemia   Relevant Medications   REPATHA SURECLICK 140 MG/ML SOAJ   ELIQUIS 5 MG TABS tablet   metoprolol succinate (TOPROL-XL) 50 MG 24 hr tablet   Other Relevant Orders   CBC with Differential/Platelet   Comprehensive metabolic panel with GFR   Lipid panel   TSH   VAS US  [facility] MEDICARE SCREEN   Insomnia   Relevant Orders   CBC with Differential/Platelet   Comprehensive metabolic panel with GFR   Lipid panel   TSH   VAS US  [facility]A MEDICARE SCREEN   PAF (paroxysmal atrial fibrillation) (HCC)   Relevant Medications   REPATHA SURECLICK 140 MG/ML SOAJ   ELIQUIS 5 MG TABS tablet   metoprolol succinate (TOPROL-XL) 50 MG 24 hr tablet   Other Relevant Orders   CBC with Differential/Platelet   Comprehensive metabolic panel with GFR   Lipid panel   TSH   VAS US  [facility]A MEDICARE SCREEN   Other Visit Diagnoses       Encounter for Medicare annual wellness exam    -  Primary   Relevant Orders   CBC with Differential/Platelet   Comprehensive metabolic panel with GFR   Lipid panel   TSH   VAS US  [facility]A MEDICARE SCREEN     Lower extremity edema       Relevant Orders   CBC with Differential/Platelet   Comprehensive metabolic panel with GFR   Lipid panel   TSH   VAS US  AORTA MEDICARE SCREEN     Prediabetes       Relevant Orders   CBC with Differential/Platelet   Comprehensive metabolic panel with GFR   Lipid panel  TSH   VAS US  AORTA MEDICARE SCREEN   Hemoglobin A1c     Weight loss counseling, encounter for       Relevant Medications   topiramate  (TOPAMAX ) 25 MG tablet        Return in about 4 weeks (around 01/24/2024) for weight loss management .     Darleene Shape, NP       [1]  Outpatient Medications Prior to Visit  Medication Sig   baclofen (LIORESAL) 10 MG tablet Take 10 mg by mouth 3 (three) times daily.   buPROPion (WELLBUTRIN XL) 150 MG 24 hr tablet Take 150 mg by mouth daily.   butalbital -acetaminophen-caffeine  (FIORICET) 50-325-40 MG tablet TAKE 1 TO 2 TABLETS BY MOUTH EVERY 6 HOURS AS NEEDED   denosumab (PROLIA) 60 MG/ML SOSY injection 60mg  Subcutaneous once every 6 months; Duration: 180 days   dorzolamide-timolol (COSOPT) 2-0.5 % ophthalmic solution 1 drop 2 (two) times daily.   ELIQUIS 5 MG TABS tablet Take 5 mg by mouth 2 (two) times daily.   ezetimibe (ZETIA) 10 MG tablet Take 10 mg by mouth daily.   fexofenadine (ALLEGRA) 180 MG tablet Take by mouth.   latanoprost (XALATAN) 0.005 % ophthalmic solution SMARTSIG:In Eye(s)   LORazepam (ATIVAN) 1 MG tablet Take 1 mg by mouth 2 (two) times daily.   metoprolol succinate (TOPROL-XL) 50 MG 24 hr tablet Take 50 mg by mouth daily.   Multiple Vitamin (THERA) TABS Take 1 tablet by mouth daily.   omeprazole (PRILOSEC) 40 MG capsule Take 40 mg by mouth daily.   ondansetron  (ZOFRAN ) 8 MG tablet Take by mouth.   promethazine (PHENERGAN) 12.5 MG tablet Take 1 or 2 tabs as needed for nausea up to three times a day   REPATHA SURECLICK 140 MG/ML SOAJ Inject into the skin.   venlafaxine XR (EFFEXOR-XR) 37.5 MG 24 hr capsule Take 37.5 mg by mouth daily.   Vitamin D, Ergocalciferol, (DRISDOL) 1.25 MG (50000 UNIT) CAPS capsule Take 50,000 Units by mouth once a week.   zolpidem (AMBIEN CR) 12.5 MG CR tablet Take 12.5 mg by mouth at bedtime.   [DISCONTINUED] esomeprazole (NEXIUM) 40 MG capsule Take by mouth.   [DISCONTINUED] famotidine  (PEPCID ) 40 MG tablet as directed Orally   [DISCONTINUED] LORazepam (ATIVAN) 0.5 MG tablet Take 1 mg by mouth 2 (two) times daily as needed.   [DISCONTINUED] metoprolol tartrate  (LOPRESSOR) 25 MG tablet Take 1/2 (one-half) tablet by mouth twice daily   [DISCONTINUED] dronedarone (MULTAQ) 400 MG tablet Take 200 mg by mouth. BID   [DISCONTINUED] fish oil-omega-3 fatty acids 1000 MG capsule Take by mouth.   [DISCONTINUED] pimecrolimus (ELIDEL) 1 % cream SMARTSIG:Sparingly Topical Twice Daily   [DISCONTINUED] PROBIOTIC PRODUCT PO Take 1 capsule by mouth daily.   No facility-administered medications prior to visit.  [2]  Allergies Allergen Reactions   Latex Itching   Alendronate Other (See Comments)    GI upset   Charentais Melon (French Melon) Swelling    Watermelon and cantelope   Clopidogrel Other (See Comments)    Fatigue   Egg Shells Swelling   Hydrocodone Other (See Comments)   Ibandronate Other (See Comments)    Myalgias and GI upset   Pneumococcal Vac Polyvalent Other (See Comments)    Patient developed localized reaction to left upper arm.  Small area of redness and swelling at injection site.   Wheat Rash   Codeine Nausea Only   Penicillins Hives and Itching

## 2023-12-28 ENCOUNTER — Ambulatory Visit: Payer: Self-pay | Admitting: Adult Health

## 2024-01-01 ENCOUNTER — Telehealth: Payer: Self-pay | Admitting: *Deleted

## 2024-01-01 NOTE — Telephone Encounter (Signed)
 Copied from CRM [id number]. Topic: Clinical - Lab/Test Results >> Jan 01, 2024  1:30 PM Mesmerise C wrote: Reason for CRM: Patient returning call provided lab results read verbatim patient stated if wanting her to start taking the Metformin immediately to send it to [address] [place], [place] [address] Open  Closes 11 PM  More hours [phone] for a 90 day supply since she'll be there for the time being with her mom

## 2024-01-08 ENCOUNTER — Other Ambulatory Visit: Payer: Self-pay

## 2024-01-08 MED ORDER — METFORMIN HCL ER 500 MG PO TB24: 500.0000 mg | ORAL_TABLET | Freq: Every day | ORAL | 0 refills | Status: AC

## 2024-01-08 NOTE — Telephone Encounter (Signed)
Noted. Medication sent to pharmacy.   

## 2024-03-27 IMAGING — CT CT ABD-PELV W/ CM
2 of 5 series · 15 of 46 positions shown, 17 images · IV contrast (APPLIED)
Comparison: 05/08/2021

CLINICAL DATA: Abdominal pain, acute, nonlocalized

EXAM:
CT ABDOMEN AND PELVIS WITH CONTRAST
TECHNIQUE: Multidetector CT imaging of the abdomen and pelvis was performed
using the standard protocol following bolus administration of
intravenous contrast.

[Series 2: axial st · axial · 0.78mm/px · z∈[+1406,+1786]mm · 12 of 90 slices shown, 14 images]
[im 7/90  soft-tissue]
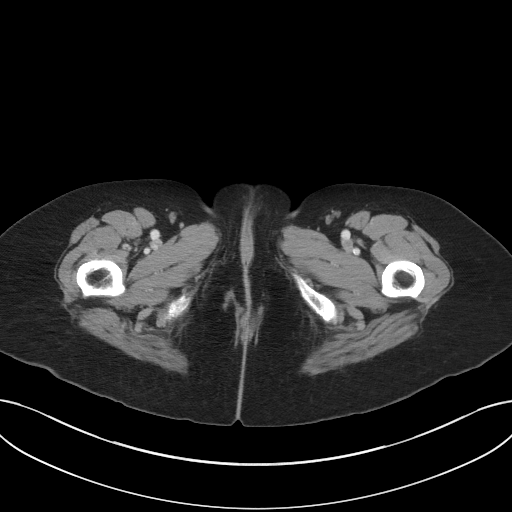
[im 7/90  bone]
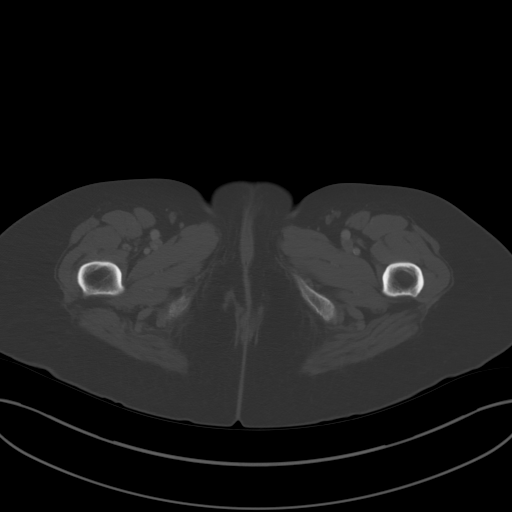
[im 14/90  soft-tissue]
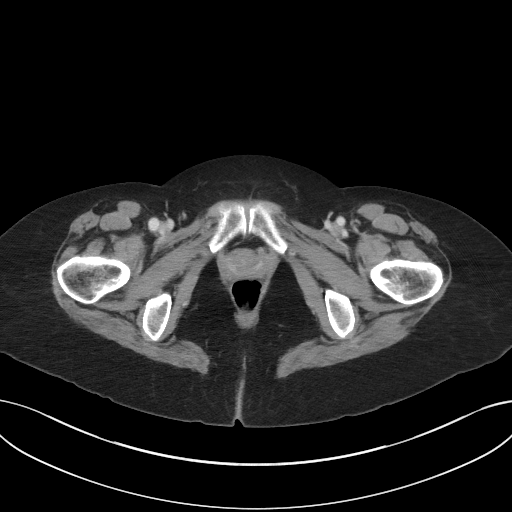
[im 21/90  soft-tissue]
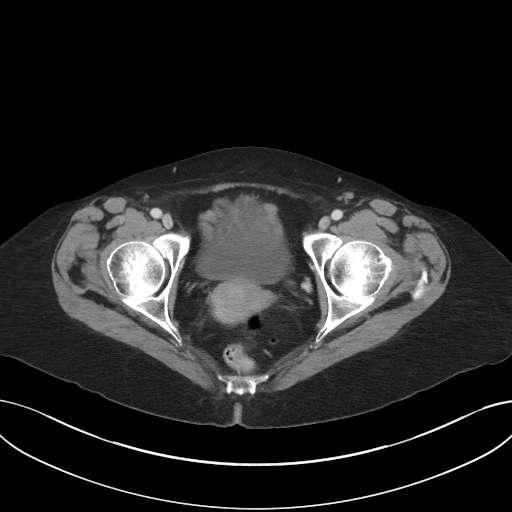
[im 28/90  soft-tissue]
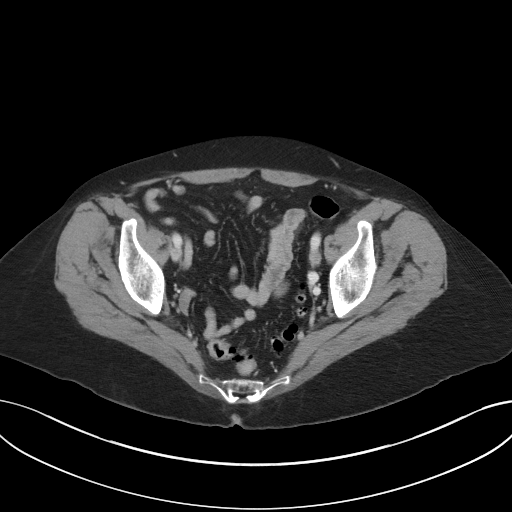
[im 35/90  soft-tissue]
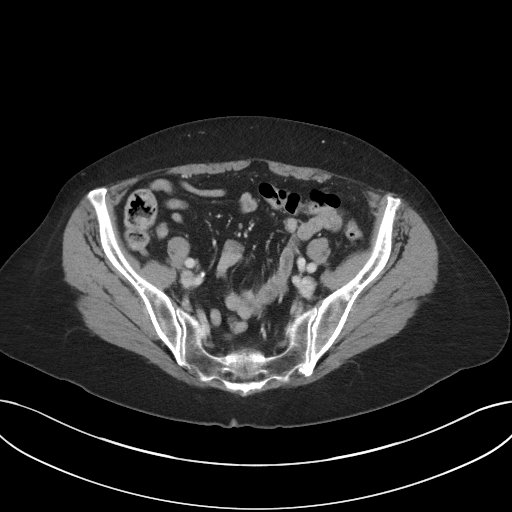
[im 42/90  soft-tissue]
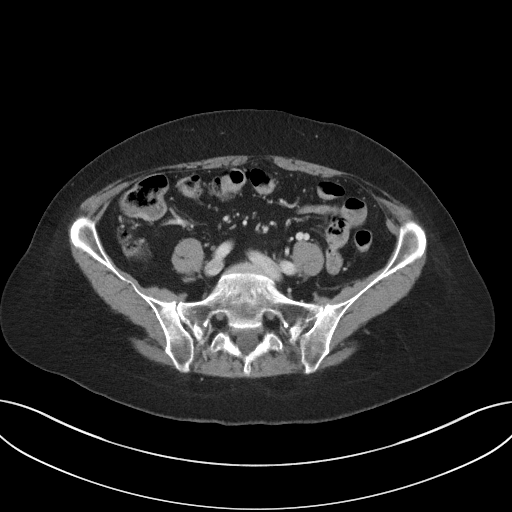
[im 48/90  soft-tissue]
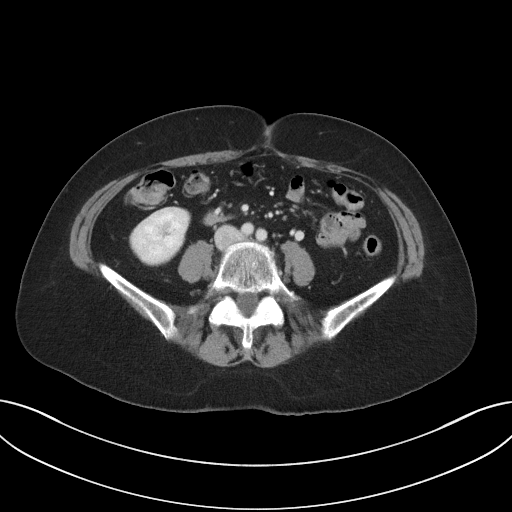
[im 55/90  soft-tissue]
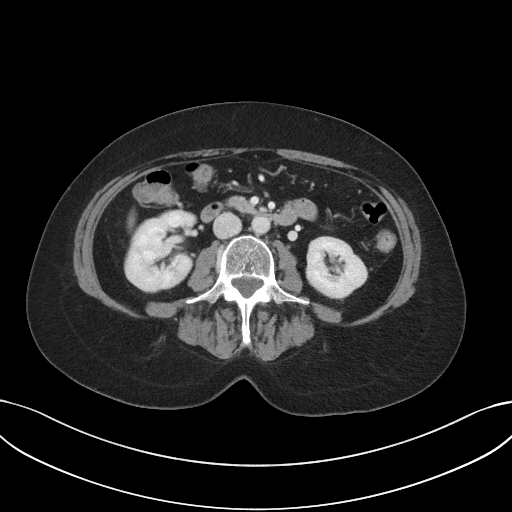
[im 62/90  soft-tissue]
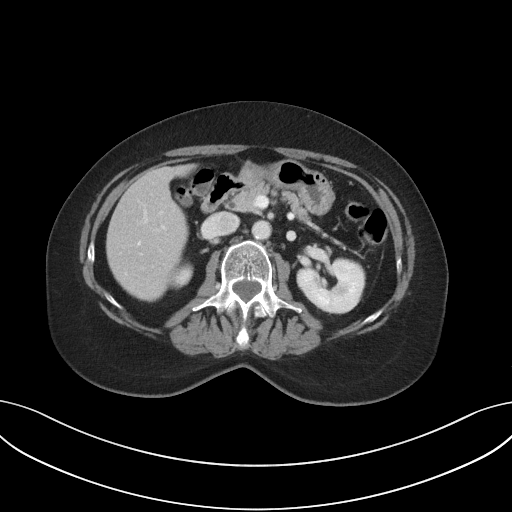
[im 62/90  bone]
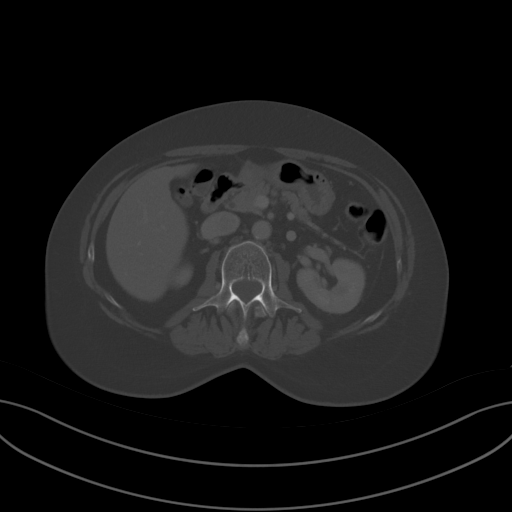
[im 69/90  soft-tissue]
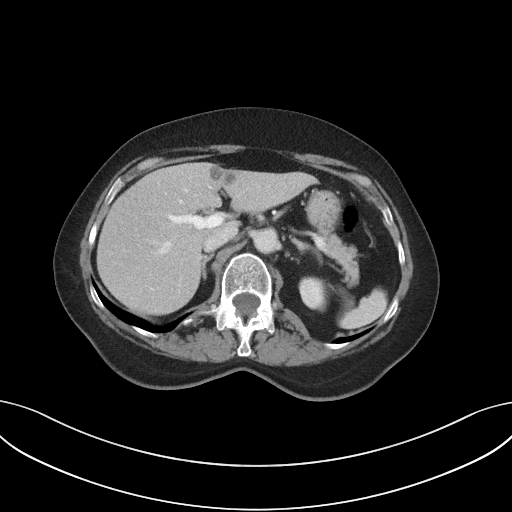
[im 76/90  soft-tissue]
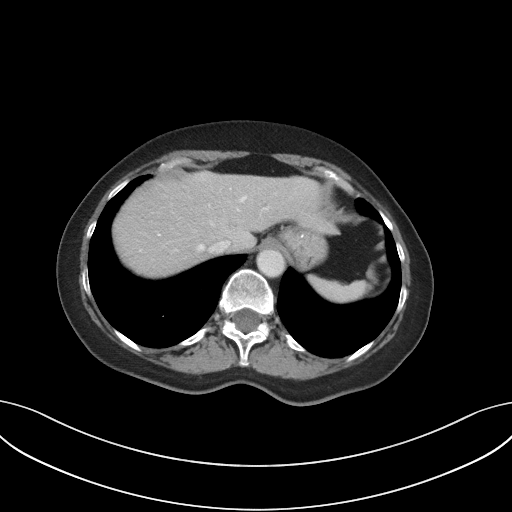
[im 83/90  soft-tissue]
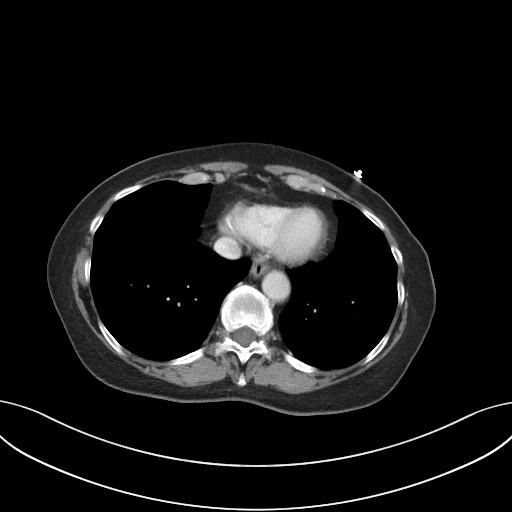

[Series 5: coronal st · coronal · 0.68mm/px · 3 of 101 slices shown]
[im 34/101  soft-tissue]
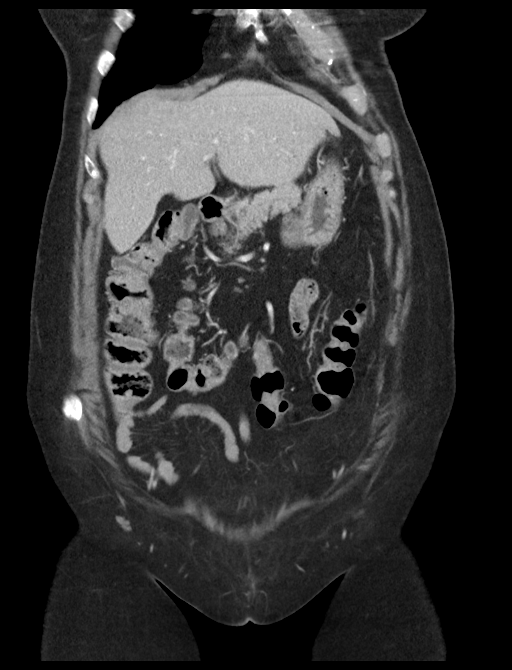
[im 45/101  soft-tissue]
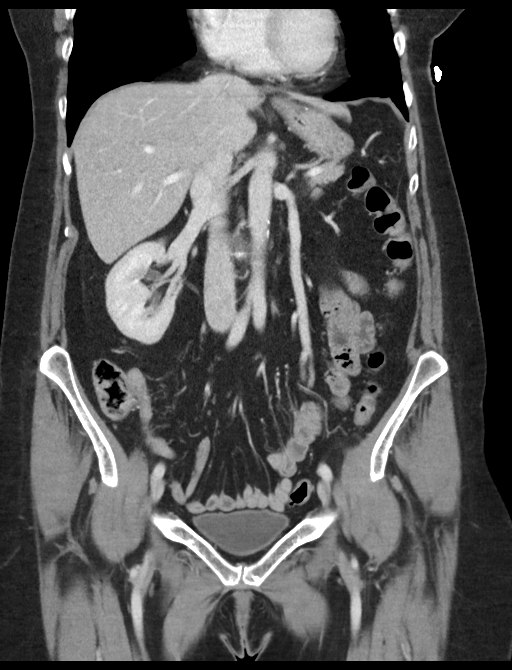
[im 56/101  soft-tissue]
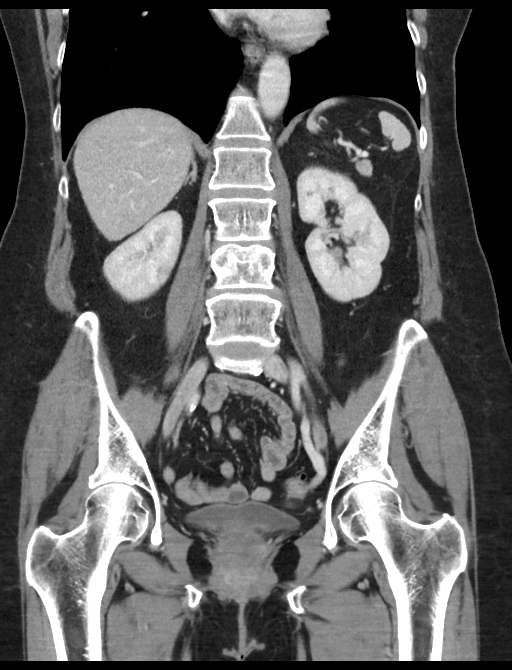

[15 of 46 positions shown; findings below may reference images not displayed]

RADIATION DOSE REDUCTION: This exam was performed according to the
departmental dose-optimization program which includes automated
exposure control, adjustment of the mA and/or kV according to
patient size and/or use of iterative reconstruction technique.

CONTRAST:  80mL OMNIPAQUE IOHEXOL 300 MG/ML  SOLN
FINDINGS: Lower chest: No acute abnormality.

Hepatobiliary: Focal fatty hepatic infiltration adjacent the
falciform ligament. Mild hepatic steatosis. No enhancing
intrahepatic mass. Cholecystectomy has been performed. No intra or
extrahepatic biliary ductal dilation

Pancreas: Unremarkable

Spleen: Unremarkable

Adrenals/Urinary Tract: Adrenal glands are unremarkable. Kidneys are
normal, without renal calculi, focal lesion, or hydronephrosis.
Bladder is unremarkable.

Stomach/Bowel: Moderate sigmoid colonic diverticulosis without
superimposed acute inflammatory change. The stomach, small bowel,
and large bowel are otherwise unremarkable.

Vascular/Lymphatic: Mild atherosclerotic calcification within the
thoracic aorta. No aortic aneurysm. Marked compression of the left
renal vein by the superior mesenteric artery with collateralization
via the left gonadal vein to the left external iliac vein. No
pathologic adenopathy within the abdomen and pelvis.

Reproductive: 2 cm uterine fundal fibroid. Pelvic organs are
otherwise unremarkable.

Other: No abdominal wall hernia.  Rectum unremarkable.

Musculoskeletal: No acute bone abnormality.
IMPRESSION: 1. No acute intra-abdominal pathology identified. No definite
radiographic explanation for the patient's reported symptoms.
2. Mild hepatic steatosis.
3. Moderate sigmoid diverticulosis without superimposed acute
inflammatory change.
4. Marked compression of the left renal vein by the superior
mesenteric artery with collateralization via the left gonadal vein
to the left external iliac vein. This can be seen in the setting of
nutcracker syndrome.
